# Patient Record
Sex: Female | Born: 1985 | Race: White | Hispanic: No | Marital: Married | Smoking: Never smoker
Health system: Southern US, Community
[De-identification: ages and names within clinical notes are randomized; demographics above are authoritative.]

## PROBLEM LIST (undated history)

## (undated) DIAGNOSIS — G43909 Migraine, unspecified, not intractable, without status migrainosus: Secondary | ICD-10-CM

---

## 2005-09-09 ENCOUNTER — Inpatient Hospital Stay (HOSPITAL_COMMUNITY): Admission: AD | Admit: 2005-09-09 | Discharge: 2005-09-09 | Payer: Self-pay | Admitting: Gynecology

## 2005-09-11 ENCOUNTER — Inpatient Hospital Stay (HOSPITAL_COMMUNITY): Admission: AD | Admit: 2005-09-11 | Discharge: 2005-09-11 | Payer: Self-pay | Admitting: Obstetrics & Gynecology

## 2009-02-07 ENCOUNTER — Emergency Department (HOSPITAL_BASED_OUTPATIENT_CLINIC_OR_DEPARTMENT_OTHER): Admission: EM | Admit: 2009-02-07 | Discharge: 2009-02-07 | Payer: Self-pay | Admitting: Emergency Medicine

## 2009-02-07 ENCOUNTER — Ambulatory Visit: Payer: Self-pay | Admitting: Diagnostic Radiology

## 2009-04-07 ENCOUNTER — Emergency Department (HOSPITAL_BASED_OUTPATIENT_CLINIC_OR_DEPARTMENT_OTHER): Admission: EM | Admit: 2009-04-07 | Discharge: 2009-04-07 | Payer: Self-pay | Admitting: Emergency Medicine

## 2012-04-20 ENCOUNTER — Encounter (HOSPITAL_BASED_OUTPATIENT_CLINIC_OR_DEPARTMENT_OTHER): Payer: Self-pay | Admitting: Emergency Medicine

## 2012-04-20 ENCOUNTER — Emergency Department (HOSPITAL_BASED_OUTPATIENT_CLINIC_OR_DEPARTMENT_OTHER)
Admission: EM | Admit: 2012-04-20 | Discharge: 2012-04-20 | Disposition: A | Discharge status: Home / Self Care | Payer: Self-pay | Attending: Emergency Medicine | Admitting: Emergency Medicine

## 2012-04-20 DIAGNOSIS — N760 Acute vaginitis: Secondary | ICD-10-CM | POA: Insufficient documentation

## 2012-04-20 DIAGNOSIS — Z3202 Encounter for pregnancy test, result negative: Secondary | ICD-10-CM | POA: Insufficient documentation

## 2012-04-20 DIAGNOSIS — R109 Unspecified abdominal pain: Secondary | ICD-10-CM | POA: Insufficient documentation

## 2012-04-20 LAB — URINALYSIS, ROUTINE W REFLEX MICROSCOPIC
Glucose, UA: NEGATIVE mg/dL
Ketones, ur: NEGATIVE mg/dL
Nitrite: NEGATIVE
Protein, ur: NEGATIVE mg/dL
Specific Gravity, Urine: 1.019 (ref 1.005–1.030)

## 2012-04-20 LAB — WET PREP, GENITAL
Trich, Wet Prep: NONE SEEN
Yeast Wet Prep HPF POC: NONE SEEN

## 2012-04-20 LAB — URINE MICROSCOPIC-ADD ON

## 2012-04-20 LAB — PREGNANCY, URINE: Preg Test, Ur: NEGATIVE

## 2012-04-20 MED ORDER — HYDROCODONE-ACETAMINOPHEN 5-325 MG PO TABS: 2.0000 | ORAL_TABLET | Freq: Once | ORAL | Status: DC

## 2012-04-20 NOTE — ED Notes (Signed)
Brown vaginal discharge with abd pain and "sharp vaginal pains" since last Thursday.  LNMP over a year ago.  She states she has always had irregular periods.  2 home pregnancy tests were negative.

## 2012-04-20 NOTE — ED Provider Notes (Signed)
History     CSN: [id number]  Arrival date & time 04/20/12  1320   First MD Initiated Contact with Patient 04/20/12 1336      Chief Complaint  Patient presents with  . Vaginal Discharge  . Abdominal Pain    (Consider location/radiation/quality/duration/timing/severity/associated sxs/prior treatment) Patient is a 27 y.o. female presenting with vaginal discharge and abdominal pain. The history is provided by the patient.  Vaginal Discharge This is a new problem. Episode onset: 8 days. The problem occurs constantly. Associated symptoms include abdominal pain. Associated symptoms comments: Vaginal discharge. Nothing aggravates the symptoms. She has tried nothing for the symptoms.  Abdominal Pain Associated symptoms: vaginal discharge     History reviewed. No pertinent past medical history.  History reviewed. No pertinent past surgical history.  No family history on file.  History  Substance Use Topics  . Smoking status: Never Smoker   . Smokeless tobacco: Not on file  . Alcohol Use: No    OB History   Grav Para Term Preterm Abortions TAB SAB Ect Mult Living                  Review of Systems  Gastrointestinal: Positive for abdominal pain.  Genitourinary: Positive for vaginal discharge.  All other systems reviewed and are negative.    Allergies  Review of patient's allergies indicates no known allergies.  Home Medications  No current outpatient prescriptions on file.  BP 160/106  Pulse 94  Temp(Src) 99 F (37.2 C) (Oral)  Ht 5\' 2"  (1.575 m)  Wt 215 lb (97.523 kg)  BMI 39.31 kg/m2  SpO2 100%  LMP 04/21/2011  Physical Exam  Nursing note and vitals reviewed. Constitutional: She appears well-developed and well-nourished.  HENT:  Head: Normocephalic.  Eyes: Conjunctivae are normal. Pupils are equal, round, and reactive to light.  Neck: Normal range of motion. Neck supple.  Cardiovascular: Normal rate.   Pulmonary/Chest: Effort normal and breath sounds  normal.  Abdominal: Soft. Bowel sounds are normal.  Genitourinary: Vaginal discharge found.  Musculoskeletal: Normal range of motion.  Neurological: She is alert.  Skin: Skin is warm.    ED Course  Procedures (including critical care time)  Labs Reviewed  WET PREP, GENITAL - Abnormal; Notable for the following:    WBC, Wet Prep HPF POC RARE (*)    All other components within normal limits  URINALYSIS, ROUTINE W REFLEX MICROSCOPIC - Abnormal; Notable for the following:    Hgb urine dipstick SMALL (*)    Leukocytes, UA TRACE (*)    All other components within normal limits  URINE MICROSCOPIC-ADD ON - Abnormal; Notable for the following:    Bacteria, UA FEW (*)    All other components within normal limits  GC/CHLAMYDIA PROBE AMP  PREGNANCY, URINE   No results found.   No diagnosis found.    MDM  Wet prep normal,  ua few bacteria,     Pt advised to follow up with her MD for recheck.   I will treat with diflucan.        Lonia Skinner Kaaawa, PA-C 04/20/12 1546

## 2012-04-21 NOTE — ED Provider Notes (Signed)
Medical screening examination/treatment/procedure(s) were performed by non-physician practitioner and as supervising physician I was immediately available for consultation/collaboration.  Doug Sou, MD 04/21/12 [phone]

## 2012-07-31 ENCOUNTER — Emergency Department (HOSPITAL_BASED_OUTPATIENT_CLINIC_OR_DEPARTMENT_OTHER)
Admission: EM | Admit: 2012-07-31 | Discharge: 2012-07-31 | Disposition: A | Discharge status: Home / Self Care | Payer: No Typology Code available for payment source | Attending: Emergency Medicine | Admitting: Emergency Medicine

## 2012-07-31 DIAGNOSIS — B86 Scabies: Secondary | ICD-10-CM

## 2012-07-31 DIAGNOSIS — J029 Acute pharyngitis, unspecified: Secondary | ICD-10-CM | POA: Insufficient documentation

## 2012-07-31 DIAGNOSIS — L299 Pruritus, unspecified: Secondary | ICD-10-CM | POA: Insufficient documentation

## 2012-07-31 DIAGNOSIS — J3489 Other specified disorders of nose and nasal sinuses: Secondary | ICD-10-CM | POA: Insufficient documentation

## 2012-07-31 DIAGNOSIS — J329 Chronic sinusitis, unspecified: Secondary | ICD-10-CM

## 2012-07-31 DIAGNOSIS — R21 Rash and other nonspecific skin eruption: Secondary | ICD-10-CM | POA: Insufficient documentation

## 2012-07-31 MED ORDER — ANTIPYRINE-BENZOCAINE 5.4-1.4 % OT SOLN
3.0000 [drp] | Freq: Once | OTIC | Status: AC
  Administered 2012-07-31: 4 [drp] via OTIC
  Filled 2012-07-31: qty 10

## 2012-07-31 MED ORDER — PSEUDOEPHEDRINE HCL 60 MG PO TABS: 60.0000 mg | ORAL_TABLET | ORAL | Status: DC | PRN

## 2012-07-31 MED ORDER — FLUTICASONE PROPIONATE 50 MCG/ACT NA SUSP: 2.0000 | Freq: Every day | NASAL | Status: DC

## 2012-07-31 MED ORDER — PERMETHRIN 5 % EX CREA: TOPICAL_CREAM | CUTANEOUS | Status: DC

## 2012-07-31 NOTE — ED Provider Notes (Signed)
   History    CSN: [id number] Arrival date & time 07/31/12  [place]on Gully  First MD Initiated Contact with Patient 07/31/12 2131     Chief Complaint  Patient presents with  . Otalgia   (Consider location/radiation/quality/duration/timing/severity/associated sxs/prior Treatment) HPI Lauren Monroe is a 27 y.o. female who presents to ED with complaint of a left ear ache. State started with sore throat yesterday, today left maxillary sinus pain and left ear pain. Denies nasal congestion. States tried tylenol and over the counter ear drops with no relief. Denies fever, chills, cough, malaise. States also has rash to the hands and feet. States her cousin had scabies several weeks ago. Rash started a week ago. States scratching it with a soft bristle brush. States itching worse at night. No treatments tried.    No past medical history on file. No past surgical history on file. No family history on file. History  Substance Use Topics  . Smoking status: Never Smoker   . Smokeless tobacco: Not on file  . Alcohol Use: No   OB History   Grav Para Term Preterm Abortions TAB SAB Ect Mult Living                 Review of Systems  Constitutional: Negative for fever and chills.  HENT: Positive for ear pain, sore throat and sinus pressure. Negative for hearing loss, congestion, neck pain, neck stiffness and ear discharge.   Eyes: Negative for pain and redness.  Respiratory: Negative.   Cardiovascular: Negative.   Gastrointestinal: Negative.   Musculoskeletal: Negative for myalgias and arthralgias.  Skin: Positive for rash.  Neurological: Negative for weakness, numbness and headaches.    Allergies  Review of patient's allergies indicates no known allergies.  Home Medications  No current outpatient prescriptions on file. BP 124/89  Pulse 105  Temp(Src) 99.1 F (37.3 C) (Oral)  Resp 20  Ht 5\' 2"  (1.575 m)  Wt 222 lb (100.699 kg)  BMI 40.59 kg/m2  SpO2 98% Physical Exam  Nursing note  and vitals reviewed. Constitutional: She appears well-developed and well-nourished. No distress.  HENT:  Head: Normocephalic and atraumatic.  Right Ear: Tympanic membrane, external ear and ear canal normal.  Left Ear: External ear and ear canal normal.  Nose: Left sinus exhibits maxillary sinus tenderness.  Mouth/Throat: Uvula is midline, oropharynx is clear and moist and mucous membranes are normal.  Left ear drug bulging, no erythema  Neck: Neck supple.  Cardiovascular: Normal rate, regular rhythm and normal heart sounds.   Pulmonary/Chest: Effort normal and breath sounds normal. No respiratory distress. She has no wheezes. She has no rales.  Skin: Skin is warm and dry.  Erythematous papular rash to the bilateral wrists, feet, ankles, toes    ED Course  Procedures (including critical care time) Labs Reviewed - No data to display No results found.  1. Sinusitis   2. Scabies     MDM  Pt with left sinus pain and pressure and left ear pain. Suspect sinusitis. Will start on flonase, sudafed, auralgan for earache. Rash consistent with scabies given appearance and recent contact. Will prescribe permethrin. Follow up with pcp.   Filed Vitals:   07/31/12 1924  BP: 124/89  Pulse: 105  Temp: 99.1 F (37.3 C)  TempSrc: Oral  Resp: 20  Height: 5\' 2"  (1.575 m)  Weight: 222 lb (100.699 kg)  SpO2: 98%     Savion Washam A Judd Mccubbin, PA-C 07/31/12 2348

## 2012-07-31 NOTE — ED Notes (Signed)
Left ear pain and sore throat x 1 day

## 2012-07-31 NOTE — ED Provider Notes (Signed)
Medical screening examination/treatment/procedure(s) were performed by non-physician practitioner and as supervising physician I was immediately available for consultation/collaboration.   Rolan Bucco, MD 07/31/12 [phone]

## 2013-04-12 ENCOUNTER — Emergency Department (HOSPITAL_BASED_OUTPATIENT_CLINIC_OR_DEPARTMENT_OTHER): Payer: No Typology Code available for payment source

## 2013-04-12 ENCOUNTER — Emergency Department (HOSPITAL_BASED_OUTPATIENT_CLINIC_OR_DEPARTMENT_OTHER)
Admission: EM | Admit: 2013-04-12 | Discharge: 2013-04-12 | Disposition: A | Discharge status: Home / Self Care | Payer: No Typology Code available for payment source | Attending: Emergency Medicine | Admitting: Emergency Medicine

## 2013-04-12 ENCOUNTER — Encounter (HOSPITAL_BASED_OUTPATIENT_CLINIC_OR_DEPARTMENT_OTHER): Payer: Self-pay | Admitting: Emergency Medicine

## 2013-04-12 DIAGNOSIS — W1789XA Other fall from one level to another, initial encounter: Secondary | ICD-10-CM | POA: Insufficient documentation

## 2013-04-12 DIAGNOSIS — S139XXA Sprain of joints and ligaments of unspecified parts of neck, initial encounter: Secondary | ICD-10-CM | POA: Insufficient documentation

## 2013-04-12 DIAGNOSIS — IMO0002 Reserved for concepts with insufficient information to code with codable children: Secondary | ICD-10-CM | POA: Insufficient documentation

## 2013-04-12 DIAGNOSIS — Y92009 Unspecified place in unspecified non-institutional (private) residence as the place of occurrence of the external cause: Secondary | ICD-10-CM | POA: Insufficient documentation

## 2013-04-12 DIAGNOSIS — S5000XA Contusion of unspecified elbow, initial encounter: Secondary | ICD-10-CM | POA: Insufficient documentation

## 2013-04-12 DIAGNOSIS — Y939 Activity, unspecified: Secondary | ICD-10-CM | POA: Insufficient documentation

## 2013-04-12 DIAGNOSIS — S161XXA Strain of muscle, fascia and tendon at neck level, initial encounter: Secondary | ICD-10-CM

## 2013-04-12 DIAGNOSIS — Z3202 Encounter for pregnancy test, result negative: Secondary | ICD-10-CM | POA: Insufficient documentation

## 2013-04-12 LAB — PREGNANCY, URINE: PREG TEST UR: NEGATIVE

## 2013-04-12 MED ORDER — CYCLOBENZAPRINE HCL 5 MG PO TABS: 5.0000 mg | ORAL_TABLET | Freq: Two times a day (BID) | ORAL | Status: DC | PRN

## 2013-04-12 NOTE — ED Provider Notes (Signed)
CSN: [id number]     Arrival date & time 04/12/13  1630 History   First MD Initiated Contact with Patient 04/12/13 1649     Chief Complaint  Patient presents with  . Fall     (Consider location/radiation/quality/duration/timing/severity/associated sxs/prior Treatment) HPI Comments: Pt state that she fell thru her porch yesterday. The porch is about 2 feet off the ground. No loc with fall. Pt states that her neck started to hurt immediately and it hasn't stopped. Denies numbness or weakness. Pt states that she also has a bruise to the left elbow  The history is provided by the patient. No language interpreter was used.    History reviewed. No pertinent past medical history. History reviewed. No pertinent past surgical history. No family history on file. History  Substance Use Topics  . Smoking status: Never Smoker   . Smokeless tobacco: Never Used  . Alcohol Use: No   OB History   Grav Para Term Preterm Abortions TAB SAB Ect Mult Living                 Review of Systems  Constitutional: Negative.   Respiratory: Negative.   Cardiovascular: Negative.       Allergies  Review of patient's allergies indicates no known allergies.  Home Medications   Current Outpatient Rx  Name  Route  Sig  Dispense  Refill  . fluticasone (FLONASE) 50 MCG/ACT nasal spray   Nasal   Place 2 sprays into the nose daily.   16 g   2   . permethrin (ELIMITE) 5 % cream      Apply neck down. Wash off after 8 hrs. Repeat in 1 wk   60 g   0   . pseudoephedrine (SUDAFED) 60 MG tablet   Oral   Take 1 tablet (60 mg total) by mouth every 4 (four) hours as needed for congestion.   30 tablet   0    BP 120/87  Pulse 99  Temp(Src) 98.1 F (36.7 C) (Oral)  Resp 20  Ht 5\' 3"  (1.6 m)  Wt 200 lb (90.719 kg)  BMI 35.44 kg/m2  SpO2 97%  LMP 02/01/2013 Physical Exam  Nursing note and vitals reviewed. Constitutional: She is oriented to person, place, and time. She appears well-developed and  well-nourished.  HENT:  Head: Normocephalic and atraumatic.  Cardiovascular: Normal rate and regular rhythm.   Pulmonary/Chest: Effort normal and breath sounds normal.  Abdominal: Soft. There is no tenderness.  Musculoskeletal:       Cervical back: She exhibits tenderness and bony tenderness. She exhibits normal range of motion.       Thoracic back: Normal.       Lumbar back: Normal.  Neurological: She is alert and oriented to person, place, and time. She exhibits normal muscle tone. Coordination normal.  Skin:  Bruise noted to the left elbow. Full rom. No gross deformity of swelling noted  Psychiatric: She has a normal mood and affect.    ED Course  Procedures (including critical care time) Labs Review Labs Reviewed  PREGNANCY, URINE   Imaging Review Dg Cervical Spine Complete  04/12/2013   CLINICAL DATA Right-sided neck pain  EXAM CERVICAL SPINE  4+ VIEWS  COMPARISON None.  FINDINGS The cervical spine is visualized to the level of C7.  The vertebral body heights are maintained. The alignment is normal. There is loss of the normal cervical lordosis with mild reversal. The prevertebral soft tissues are normal. There is no acute fracture or  static listhesis. Bilateral neural foramina are patent. The disc spaces are maintained.  IMPRESSION Negative cervical spine radiographs.  SIGNATURE  Electronically Signed   By: Elige Ko   On: 04/12/2013 17:42     EKG Interpretation None      MDM   Final diagnoses:  Cervical strain    Pt is neurologically intact. No acute injury noted. Will give flexeril and follow up with Dr. Pearletha Forge as needed    Teressa Lower, NP 04/12/13 1757

## 2013-04-12 NOTE — ED Provider Notes (Signed)
Medical screening examination/treatment/procedure(s) were performed by non-physician practitioner and as supervising physician I was immediately available for consultation/collaboration.   EKG Interpretation None        Khole Branch, MD 04/12/13 1847 

## 2013-04-12 NOTE — ED Notes (Signed)
C/o neck pain and left elbow pain after falling on porch yesterday

## 2013-04-12 NOTE — Discharge Instructions (Signed)
Cervical Strain and Sprain (Whiplash)  with Rehab  Cervical strain and sprains are injuries that commonly occur with "whiplash" injuries. Whiplash occurs when the neck is forcefully whipped backward or forward, such as during a motor vehicle accident. The muscles, ligaments, tendons, discs and nerves of the neck are susceptible to injury when this occurs.  SYMPTOMS   · Pain or stiffness in the front and/or back of neck  · Symptoms may present immediately or up to 24 hours after injury.  · Dizziness, headache, nausea and vomiting.  · Muscle spasm with soreness and stiffness in the neck.  · Tenderness and swelling at the injury site.  CAUSES   Whiplash injuries often occur during contact sports or motor vehicle accidents.   RISK INCREASES WITH:  · Osteoarthritis of the spine.  · Situations that make head or neck accidents or trauma more likely.  · High-risk sports (football, rugby, wrestling, hockey, auto racing, gymnastics, diving, contact karate or boxing).  · Poor strength and flexibility of the neck.  · Previous neck injury.  · Poor tackling technique.  · Improperly fitted or padded equipment.  PREVENTION  · Learn and use proper technique (avoid tackling with the head, spearing and head-butting; use proper falling techniques to avoid landing on the head).  · Warm up and stretch properly before activity.  · Maintain physical fitness:  · Strength, flexibility and endurance.  · Cardiovascular fitness.  · Wear properly fitted and padded protective equipment, such as padded soft collars, for participation in contact sports.  PROGNOSIS   Recovery for cervical strain and sprain injuries is dependent on the extent of the injury. These injuries are usually curable in 1 week to 3 months with appropriate treatment.   RELATED COMPLICATIONS   · Temporary numbness and weakness may occur if the nerve roots are damaged, and this may persist until the nerve has completely healed.  · Chronic pain due to frequent recurrence of  symptoms.  · Prolonged healing, especially if activity is resumed too soon (before complete recovery).  TREATMENT   Treatment initially involves the use of ice and medication to help reduce pain and inflammation. It is also important to perform strengthening and stretching exercises and modify activities that worsen symptoms so the injury does not get worse. These exercises may be performed at home or with a therapist. For patients who experience severe symptoms, a soft padded collar may be recommended to be worn around the neck.   Improving your posture may help reduce symptoms. Posture improvement includes pulling your chin and abdomen in while sitting or standing. If you are sitting, sit in a firm chair with your buttocks against the back of the chair. While sleeping, try replacing your pillow with a small towel rolled to 2 inches in diameter, or use a cervical pillow or soft cervical collar. Poor sleeping positions delay healing.   For patients with nerve root damage, which causes numbness or weakness, the use of a cervical traction apparatus may be recommended. Surgery is rarely necessary for these injuries. However, cervical strain and sprains that are present at birth (congenital) may require surgery.  MEDICATION   · If pain medication is necessary, nonsteroidal anti-inflammatory medications, such as aspirin and ibuprofen, or other minor pain relievers, such as acetaminophen, are often recommended.  · Do not take pain medication for 7 days before surgery.  · Prescription pain relievers may be given if deemed necessary by your caregiver. Use only as directed and only as much as you   need.  HEAT AND COLD:   · Cold treatment (icing) relieves pain and reduces inflammation. Cold treatment should be applied for 10 to 15 minutes every 2 to 3 hours for inflammation and pain and immediately after any activity that aggravates your symptoms. Use ice packs or an ice massage.  · Heat treatment may be used prior to  performing the stretching and strengthening activities prescribed by your caregiver, physical therapist, or athletic trainer. Use a heat pack or a warm soak.  SEEK MEDICAL CARE IF:   · Symptoms get worse or do not improve in 2 weeks despite treatment.  · New, unexplained symptoms develop (drugs used in treatment may produce side effects).  EXERCISES  RANGE OF MOTION (ROM) AND STRETCHING EXERCISES - Cervical Strain and Sprain  These exercises may help you when beginning to rehabilitate your injury. In order to successfully resolve your symptoms, you must improve your posture. These exercises are designed to help reduce the forward-head and rounded-shoulder posture which contributes to this condition. Your symptoms may resolve with or without further involvement from your physician, physical therapist or athletic trainer. While completing these exercises, remember:   · Restoring tissue flexibility helps normal motion to return to the joints. This allows healthier, less painful movement and activity.  · An effective stretch should be held for at least 20 seconds, although you may need to begin with shorter hold times for comfort.  · A stretch should never be painful. You should only feel a gentle lengthening or release in the stretched tissue.  STRETCH- Axial Extensors  · Lie on your back on the floor. You may bend your knees for comfort. Place a rolled up hand towel or dish towel, about 2 inches in diameter, under the part of your head that makes contact with the floor.  · Gently tuck your chin, as if trying to make a "double chin," until you feel a gentle stretch at the base of your head.  · Hold __________ seconds.  Repeat __________ times. Complete this exercise __________ times per day.   STRETECH - Axial Extension   · Stand or sit on a firm surface. Assume a good posture: chest up, shoulders drawn back, abdominal muscles slightly tense, knees unlocked (if standing) and feet hip width apart.  · Slowly retract your  chin so your head slides back and your chin slightly lowers.Continue to look straight ahead.  · You should feel a gentle stretch in the back of your head. Be certain not to feel an aggressive stretch since this can cause headaches later.  · Hold for __________ seconds.  Repeat __________ times. Complete this exercise __________ times per day.  STRETCH  Cervical Side Bend   · Stand or sit on a firm surface. Assume a good posture: chest up, shoulders drawn back, abdominal muscles slightly tense, knees unlocked (if standing) and feet hip width apart.  · Without letting your nose or shoulders move, slowly tip your right / left ear to your shoulder until your feel a gentle stretch in the muscles on the opposite side of your neck.  · Hold __________ seconds.  Repeat __________ times. Complete this exercise __________ times per day.  STRETCH  Cervical Rotators   · Stand or sit on a firm surface. Assume a good posture: chest up, shoulders drawn back, abdominal muscles slightly tense, knees unlocked (if standing) and feet hip width apart.  · Keeping your eyes level with the ground, slowly turn your head until you feel a gentle   stretch along the back and opposite side of your neck.  · Hold __________ seconds.  Repeat __________ times. Complete this exercise __________ times per day.  RANGE OF MOTION - Neck Circles   · Stand or sit on a firm surface. Assume a good posture: chest up, shoulders drawn back, abdominal muscles slightly tense, knees unlocked (if standing) and feet hip width apart.  · Gently roll your head down and around from the back of one shoulder to the back of the other. The motion should never be forced or painful.  · Repeat the motion 10-20 times, or until you feel the neck muscles relax and loosen.  Repeat __________ times. Complete the exercise __________ times per day.  STRENGTHENING EXERCISES - Cervical Strain and Sprain  These exercises may help you when beginning to rehabilitate your injury. They may  resolve your symptoms with or without further involvement from your physician, physical therapist or athletic trainer. While completing these exercises, remember:   · Muscles can gain both the endurance and the strength needed for everyday activities through controlled exercises.  · Complete these exercises as instructed by your physician, physical therapist or athletic trainer. Progress the resistance and repetitions only as guided.  · You may experience muscle soreness or fatigue, but the pain or discomfort you are trying to eliminate should never worsen during these exercises. If this pain does worsen, stop and make certain you are following the directions exactly. If the pain is still present after adjustments, discontinue the exercise until you can discuss the trouble with your clinician.  STRENGTH Cervical Flexors, Isometric  · Face a wall, standing about 6 inches away. Place a small pillow, a ball about 6-8 inches in diameter, or a folded towel between your forehead and the wall.  · Slightly tuck your chin and gently push your forehead into the soft object. Push only with mild to moderate intensity, building up tension gradually. Keep your jaw and forehead relaxed.  · Hold 10 to 20 seconds. Keep your breathing relaxed.  · Release the tension slowly. Relax your neck muscles completely before you start the next repetition.  Repeat __________ times. Complete this exercise __________ times per day.  STRENGTH- Cervical Lateral Flexors, Isometric   · Stand about 6 inches away from a wall. Place a small pillow, a ball about 6-8 inches in diameter, or a folded towel between the side of your head and the wall.  · Slightly tuck your chin and gently tilt your head into the soft object. Push only with mild to moderate intensity, building up tension gradually. Keep your jaw and forehead relaxed.  · Hold 10 to 20 seconds. Keep your breathing relaxed.  · Release the tension slowly. Relax your neck muscles completely before  you start the next repetition.  Repeat __________ times. Complete this exercise __________ times per day.  STRENGTH  Cervical Extensors, Isometric   · Stand about 6 inches away from a wall. Place a small pillow, a ball about 6-8 inches in diameter, or a folded towel between the back of your head and the wall.  · Slightly tuck your chin and gently tilt your head back into the soft object. Push only with mild to moderate intensity, building up tension gradually. Keep your jaw and forehead relaxed.  · Hold 10 to 20 seconds. Keep your breathing relaxed.  · Release the tension slowly. Relax your neck muscles completely before you start the next repetition.  Repeat __________ times. Complete this exercise __________ times per   day.  POSTURE AND BODY MECHANICS CONSIDERATIONS - Cervical Strain and Sprain  Keeping correct posture when sitting, standing or completing your activities will reduce the stress put on different body tissues, allowing injured tissues a chance to heal and limiting painful experiences. The following are general guidelines for improved posture. Your physician or physical therapist will provide you with any instructions specific to your needs. While reading these guidelines, remember:  · The exercises prescribed by your provider will help you have the flexibility and strength to maintain correct postures.  · The correct posture provides the optimal environment for your joints to work. All of your joints have less wear and tear when properly supported by a spine with good posture. This means you will experience a healthier, less painful body.  · Correct posture must be practiced with all of your activities, especially prolonged sitting and standing. Correct posture is as important when doing repetitive low-stress activities (typing) as it is when doing a single heavy-load activity (lifting).  PROLONGED STANDING WHILE SLIGHTLY LEANING FORWARD  When completing a task that requires you to lean forward while  standing in one place for a long time, place either foot up on a stationary 2-4 inch high object to help maintain the best posture. When both feet are on the ground, the low back tends to lose its slight inward curve. If this curve flattens (or becomes too large), then the back and your other joints will experience too much stress, fatigue more quickly and can cause pain.   RESTING POSITIONS  Consider which positions are most painful for you when choosing a resting position. If you have pain with flexion-based activities (sitting, bending, stooping, squatting), choose a position that allows you to rest in a less flexed posture. You would want to avoid curling into a fetal position on your side. If your pain worsens with extension-based activities (prolonged standing, working overhead), avoid resting in an extended position such as sleeping on your stomach. Most people will find more comfort when they rest with their spine in a more neutral position, neither too rounded nor too arched. Lying on a non-sagging bed on your side with a pillow between your knees, or on your back with a pillow under your knees will often provide some relief. Keep in mind, being in any one position for a prolonged period of time, no matter how correct your posture, can still lead to stiffness.  WALKING  Walk with an upright posture. Your ears, shoulders and hips should all line-up.  OFFICE WORK  When working at a desk, create an environment that supports good, upright posture. Without extra support, muscles fatigue and lead to excessive strain on joints and other tissues.  CHAIR:  · A chair should be able to slide under your desk when your back makes contact with the back of the chair. This allows you to work closely.  · The chair's height should allow your eyes to be level with the upper part of your monitor and your hands to be slightly lower than your elbows.  · Body position:  · Your feet should make contact with the floor. If this is  not possible, use a foot rest.  · Keep your ears over your shoulders. This will reduce stress on your neck and low back.  Document Released: 01/19/2005 Document Revised: 05/16/2012 Document Reviewed: 05/03/2008  [employer]® Patient Information ©2014 [employer].

## 2013-05-22 ENCOUNTER — Encounter (HOSPITAL_BASED_OUTPATIENT_CLINIC_OR_DEPARTMENT_OTHER): Payer: Self-pay | Admitting: Emergency Medicine

## 2013-05-22 ENCOUNTER — Emergency Department (HOSPITAL_BASED_OUTPATIENT_CLINIC_OR_DEPARTMENT_OTHER)
Admission: EM | Admit: 2013-05-22 | Discharge: 2013-05-22 | Disposition: A | Discharge status: Home / Self Care | Payer: No Typology Code available for payment source | Attending: Emergency Medicine | Admitting: Emergency Medicine

## 2013-05-22 DIAGNOSIS — Z79899 Other long term (current) drug therapy: Secondary | ICD-10-CM | POA: Insufficient documentation

## 2013-05-22 DIAGNOSIS — J020 Streptococcal pharyngitis: Secondary | ICD-10-CM | POA: Insufficient documentation

## 2013-05-22 DIAGNOSIS — IMO0002 Reserved for concepts with insufficient information to code with codable children: Secondary | ICD-10-CM | POA: Insufficient documentation

## 2013-05-22 LAB — RAPID STREP SCREEN (MED CTR MEBANE ONLY): Streptococcus, Group A Screen (Direct): POSITIVE — AB

## 2013-05-22 MED ORDER — LIDOCAINE VISCOUS 2 % MT SOLN
15.0000 mL | Freq: Once | OROMUCOSAL | Status: AC
  Administered 2013-05-22: 15 mL via OROMUCOSAL
  Filled 2013-05-22: qty 15

## 2013-05-22 MED ORDER — HYDROCODONE-HOMATROPINE 5-1.5 MG/5ML PO SYRP: 5.0000 mL | ORAL_SOLUTION | Freq: Four times a day (QID) | ORAL | Status: DC | PRN

## 2013-05-22 MED ORDER — PENICILLIN G BENZATHINE 1200000 UNIT/2ML IM SUSP
1.2000 10*6.[IU] | Freq: Once | INTRAMUSCULAR | Status: AC
  Administered 2013-05-22: 1.2 10*6.[IU] via INTRAMUSCULAR
  Filled 2013-05-22: qty 2

## 2013-05-22 NOTE — ED Notes (Signed)
Sore throat and congestion since sat. Had low grade fever last night.

## 2013-05-22 NOTE — Discharge Instructions (Signed)
Please follow up with your primary care physician in 1-2 days. If you do not have one please call the [facility] number listed above. Please use Hycodan as prescribed for throat pain, please do not drive on this medication as it contains a narcotic. Please read all discharge instructions and return precautions.    Pharyngitis Pharyngitis is redness, pain, and swelling (inflammation) of your pharynx.  CAUSES  Pharyngitis is usually caused by infection. Most of the time, these infections are from viruses (viral) and are part of a cold. However, sometimes pharyngitis is caused by bacteria (bacterial). Pharyngitis can also be caused by allergies. Viral pharyngitis may be spread from person to person by coughing, sneezing, and personal items or utensils (cups, forks, spoons, toothbrushes). Bacterial pharyngitis may be spread from person to person by more intimate contact, such as kissing.  SIGNS AND SYMPTOMS  Symptoms of pharyngitis include:   Sore throat.   Tiredness (fatigue).   Low-grade fever.   Headache.  Joint pain and muscle aches.  Skin rashes.  Swollen lymph nodes.  Plaque-like film on throat or tonsils (often seen with bacterial pharyngitis). DIAGNOSIS  Your health care provider will ask you questions about your illness and your symptoms. Your medical history, along with a physical exam, is often all that is needed to diagnose pharyngitis. Sometimes, a rapid strep test is done. Other lab tests may also be done, depending on the suspected cause.  TREATMENT  Viral pharyngitis will usually get better in 3 4 days without the use of medicine. Bacterial pharyngitis is treated with medicines that kill germs (antibiotics).  HOME CARE INSTRUCTIONS   Drink enough water and fluids to keep your urine clear or pale yellow.   Only take over-the-counter or prescription medicines as directed by your health care provider:   If you are prescribed antibiotics, make  sure you finish them even if you start to feel better.   Do not take aspirin.   Get lots of rest.   Gargle with 8 oz of salt water ( tsp of salt per 1 qt of water) as often as every 1 2 hours to soothe your throat.   Throat lozenges (if you are not at risk for choking) or sprays may be used to soothe your throat. SEEK MEDICAL CARE IF:   You have large, tender lumps in your neck.  You have a rash.  You cough up green, yellow-brown, or bloody spit. SEEK IMMEDIATE MEDICAL CARE IF:   Your neck becomes stiff.  You drool or are unable to swallow liquids.  You vomit or are unable to keep medicines or liquids down.  You have severe pain that does not go away with the use of recommended medicines.  You have trouble breathing (not caused by a stuffy nose). MAKE SURE YOU:   Understand these instructions.  Will watch your condition.  Will get help right away if you are not doing well or get worse. Document Released: 01/19/2005 Document Revised: 11/09/2012 Document Reviewed: 09/26/2012 [facility] Patient Information 2014 [place], [place].

## 2013-05-22 NOTE — ED Provider Notes (Signed)
CSN: [id number]     Arrival date & time 05/22/13  1147 History   First MD Initiated Contact with Patient 05/22/13 1248     Chief Complaint  Patient presents with  . Sore Throat     (Consider location/radiation/quality/duration/timing/severity/associated sxs/prior Treatment) HPI Comments: Patient is a 28 year old female presented to emergency department for 3 days of sore throat, nasal congestion, rhinorrhea and fever (TMAX 100.69F last evening). Patient states she has been taking Tylenol for her symptoms with improvement of fever. She denies any alleviating factors for her throat. She states eating and drinking aggravate her pain. She states she has been in the hospital recently visiting an ill relative but otherwise denies any known sick contacts. Denies any voice changes, difficulty swallowing, cough.   History reviewed. No pertinent past medical history. History reviewed. No pertinent past surgical history. No family history on file. History  Substance Use Topics  . Smoking status: Never Smoker   . Smokeless tobacco: Never Used  . Alcohol Use: No   OB History   Grav Para Term Preterm Abortions TAB SAB Ect Mult Living                 Review of Systems  Constitutional: Positive for fever.  HENT: Positive for congestion, rhinorrhea and sore throat. Negative for trouble swallowing and voice change.   All other systems reviewed and are negative.     Allergies  Review of patient's allergies indicates no known allergies.  Home Medications   Prior to Admission medications   Medication Sig Start Date End Date Taking? Authorizing Provider  acetaminophen (TYLENOL) 500 MG tablet Take 500 mg by mouth every 6 (six) hours as needed.   Yes Historical Provider, MD  guaiFENesin (MUCINEX) 600 MG 12 hr tablet Take 600 mg by mouth 2 (two) times daily.   Yes Historical Provider, MD  cyclobenzaprine (FLEXERIL) 5 MG tablet Take 1 tablet (5 mg total) by mouth 2 (two) times daily as needed for  muscle spasms. 04/12/13   Teressa LowerVrinda Pickering, NP  fluticasone (FLONASE) 50 MCG/ACT nasal spray Place 2 sprays into the nose daily. 07/31/12   Tatyana A Kirichenko, PA-C  permethrin (ELIMITE) 5 % cream Apply neck down. Wash off after 8 hrs. Repeat in 1 wk 07/31/12   Tatyana A Kirichenko, PA-C  pseudoephedrine (SUDAFED) 60 MG tablet Take 1 tablet (60 mg total) by mouth every 4 (four) hours as needed for congestion. 07/31/12   Tatyana A Kirichenko, PA-C   BP 125/73  Pulse 99  Temp(Src) 99.5 F (37.5 C) (Oral)  Resp 20  SpO2 99%  LMP 02/01/2013 Physical Exam  Nursing note and vitals reviewed. Constitutional: She is oriented to person, place, and time. She appears well-developed and well-nourished. No distress.  HENT:  Head: Normocephalic and atraumatic.  Right Ear: External ear normal.  Left Ear: External ear normal.  Nose: Nose normal.  Mouth/Throat: Uvula is midline and mucous membranes are normal. No trismus in the jaw. No dental abscesses or uvula swelling. Posterior oropharyngeal erythema present. No oropharyngeal exudate or tonsillar abscesses.  Eyes: Conjunctivae are normal.  Neck: Normal range of motion. Neck supple.  Cardiovascular: Normal rate, regular rhythm and normal heart sounds.   Pulmonary/Chest: Effort normal and breath sounds normal. No respiratory distress.  Abdominal: Soft. There is no tenderness.  Musculoskeletal: Normal range of motion.  Neurological: She is alert and oriented to person, place, and time.  Skin: Skin is warm and dry. She is not diaphoretic.  Psychiatric: She has a  normal mood and affect.    ED Course  Procedures (including critical care time) Medications  penicillin g benzathine (BICILLIN LA) 1200000 UNIT/2ML injection 1.2 Million Units (1.2 Million Units Intramuscular Given 05/22/13 1310)  lidocaine (XYLOCAINE) 2 % viscous mouth solution 15 mL (15 mLs Mouth/Throat Given 05/22/13 1310)    Labs Review Labs Reviewed  RAPID STREP SCREEN - Abnormal;  Notable for the following:    Streptococcus, Group A Screen (Direct) POSITIVE (*)    All other components within normal limits    Imaging Review No results found.   EKG Interpretation None      MDM   Final diagnoses:  Strep pharyngitis    Filed Vitals:   05/22/13 1247  BP: 125/73  Pulse: 99  Temp: 99.5 F (37.5 C)  Resp: 20   Afebrile, NAD, non-toxic appearing, AAOx4.  Pt afebrile with tonsillar erythema, cervical lymphadenopathy, & dysphagia; diagnosis of strep. Treated in the Ed with xylocaine and PCN IM.  Pt appears mildly dehydrated, discussed importance of water rehydration. Presentation non concerning for PTA or infxn spread to soft tissue. No trismus or uvula deviation. Specific return precautions discussed. Pt able to drink water in ED without difficulty with intact air way. Recommended PCP follow up. Patient is agreeable to plan. Patient is stable at time of discharge       Jeannetta EllisJennifer L Chantry Headen, PA-C 05/22/13 1425

## 2013-05-24 NOTE — ED Provider Notes (Signed)
Medical screening examination/treatment/procedure(s) were performed by non-physician practitioner and as supervising physician I was immediately available for consultation/collaboration.   EKG Interpretation None        Jamas Jaquay B. Estle Huguley, MD 05/24/13 1353 

## 2014-05-27 ENCOUNTER — Emergency Department (HOSPITAL_BASED_OUTPATIENT_CLINIC_OR_DEPARTMENT_OTHER)
Admission: EM | Admit: 2014-05-27 | Discharge: 2014-05-27 | Disposition: A | Discharge status: Home / Self Care | Payer: No Typology Code available for payment source | Attending: Emergency Medicine | Admitting: Emergency Medicine

## 2014-05-27 ENCOUNTER — Encounter (HOSPITAL_BASED_OUTPATIENT_CLINIC_OR_DEPARTMENT_OTHER): Payer: Self-pay | Admitting: *Deleted

## 2014-05-27 ENCOUNTER — Emergency Department (HOSPITAL_BASED_OUTPATIENT_CLINIC_OR_DEPARTMENT_OTHER): Payer: No Typology Code available for payment source

## 2014-05-27 DIAGNOSIS — Z7951 Long term (current) use of inhaled steroids: Secondary | ICD-10-CM | POA: Insufficient documentation

## 2014-05-27 DIAGNOSIS — Z3202 Encounter for pregnancy test, result negative: Secondary | ICD-10-CM | POA: Insufficient documentation

## 2014-05-27 DIAGNOSIS — Z79899 Other long term (current) drug therapy: Secondary | ICD-10-CM | POA: Insufficient documentation

## 2014-05-27 DIAGNOSIS — K5900 Constipation, unspecified: Secondary | ICD-10-CM | POA: Insufficient documentation

## 2014-05-27 LAB — URINE MICROSCOPIC-ADD ON

## 2014-05-27 LAB — PREGNANCY, URINE: PREG TEST UR: NEGATIVE

## 2014-05-27 LAB — URINALYSIS, ROUTINE W REFLEX MICROSCOPIC
BILIRUBIN URINE: NEGATIVE
Glucose, UA: NEGATIVE mg/dL
Ketones, ur: NEGATIVE mg/dL
LEUKOCYTES UA: NEGATIVE
NITRITE: NEGATIVE
Protein, ur: NEGATIVE mg/dL
SPECIFIC GRAVITY, URINE: 1.021 (ref 1.005–1.030)
Urobilinogen, UA: 1 mg/dL (ref 0.0–1.0)
pH: 6 (ref 5.0–8.0)

## 2014-05-27 MED ORDER — DOCUSATE SODIUM 100 MG PO CAPS: 100.0000 mg | ORAL_CAPSULE | Freq: Two times a day (BID) | ORAL | Status: DC

## 2014-05-27 MED ORDER — POLYETHYLENE GLYCOL 3350 17 G PO PACK: 17.0000 g | PACK | Freq: Every day | ORAL | Status: DC

## 2014-05-27 MED ORDER — DICYCLOMINE HCL 20 MG PO TABS: 20.0000 mg | ORAL_TABLET | Freq: Two times a day (BID) | ORAL | Status: DC | PRN

## 2014-05-27 NOTE — ED Provider Notes (Signed)
CSN: [id number]     Arrival date & time 05/27/14  0051 History   First MD Initiated Contact with Patient 05/27/14 0300     Chief Complaint  Patient presents with  . Constipation     (Consider location/radiation/quality/duration/timing/severity/associated sxs/prior Treatment) HPI Patient presents with constipation for the last 2 weeks. Is associated with mild abdominal cramping. No nausea or vomiting. No fever or chills. Patient states he has tried several enemas without relief. Denies any recent dietary changes but has started taking Effexor over the last month. History reviewed. No pertinent past medical history. History reviewed. No pertinent past surgical history. No family history on file. History  Substance Use Topics  . Smoking status: Never Smoker   . Smokeless tobacco: Never Used  . Alcohol Use: No   OB History    No data available     Review of Systems  Constitutional: Negative for fever and chills.  Respiratory: Negative for shortness of breath.   Cardiovascular: Negative for chest pain.  Gastrointestinal: Positive for abdominal pain and constipation. Negative for nausea, vomiting, diarrhea and blood in stool.  Genitourinary: Negative for dysuria and flank pain.  Musculoskeletal: Negative for back pain, neck pain and neck stiffness.  Skin: Negative for rash and wound.  Neurological: Negative for dizziness, weakness, light-headedness, numbness and headaches.  All other systems reviewed and are negative.     Allergies  Zoloft  Home Medications   Prior to Admission medications   Medication Sig Start Date End Date Taking? Authorizing Provider  venlafaxine (EFFEXOR) 75 MG tablet Take 75 mg by mouth 2 (two) times daily.   Yes Historical Provider, MD  acetaminophen (TYLENOL) 500 MG tablet Take 500 mg by mouth every 6 (six) hours as needed.    Historical Provider, MD  cyclobenzaprine (FLEXERIL) 5 MG tablet Take 1 tablet (5 mg total) by mouth 2 (two) times daily as  needed for muscle spasms. 04/12/13   Teressa Lower, NP  dicyclomine (BENTYL) 20 MG tablet Take 1 tablet (20 mg total) by mouth 2 (two) times daily as needed for spasms. 05/27/14   Loren Racer, MD  docusate sodium (COLACE) 100 MG capsule Take 1 capsule (100 mg total) by mouth every 12 (twelve) hours. 05/27/14   Loren Racer, MD  fluticasone (FLONASE) 50 MCG/ACT nasal spray Place 2 sprays into the nose daily. 07/31/12   Tatyana Kirichenko, PA-C  guaiFENesin (MUCINEX) 600 MG 12 hr tablet Take 600 mg by mouth 2 (two) times daily.    Historical Provider, MD  HYDROcodone-homatropine (HYCODAN) 5-1.5 MG/5ML syrup Take 5 mLs by mouth every 6 (six) hours as needed for cough (sore throat). 05/22/13   Jennifer Piepenbrink, PA-C  permethrin (ELIMITE) 5 % cream Apply neck down. Wash off after 8 hrs. Repeat in 1 wk 07/31/12   Tatyana Kirichenko, PA-C  polyethylene glycol (MIRALAX / GLYCOLAX) packet Take 17 g by mouth daily. 05/27/14   Loren Racer, MD  pseudoephedrine (SUDAFED) 60 MG tablet Take 1 tablet (60 mg total) by mouth every 4 (four) hours as needed for congestion. 07/31/12   Tatyana Kirichenko, PA-C   BP 114/70 mmHg  Pulse 87  Temp(Src) 98.7 F (37.1 C) (Oral)  Resp 22  Wt 221 lb 12.8 oz (100.608 kg)  SpO2 97%  LMP 04/08/2014 Physical Exam  Constitutional: She is oriented to person, place, and time. She appears well-developed and well-nourished. No distress.  HENT:  Head: Normocephalic and atraumatic.  Mouth/Throat: Oropharynx is clear and moist.  Eyes: EOM are normal. Pupils are  equal, round, and reactive to light.  Neck: Normal range of motion. Neck supple.  Cardiovascular: Normal rate and regular rhythm.   Pulmonary/Chest: Effort normal and breath sounds normal. No respiratory distress. She has no wheezes. She has no rales.  Abdominal: Soft. Bowel sounds are normal. She exhibits no distension and no mass. There is no tenderness. There is no rebound and no guarding.  Musculoskeletal:  Normal range of motion. She exhibits no edema or tenderness.  No CVA tenderness.  Neurological: She is alert and oriented to person, place, and time.  Skin: Skin is warm and dry. No rash noted. No erythema.  Psychiatric: She has a normal mood and affect. Her behavior is normal.  Nursing note and vitals reviewed.   ED Course  Procedures (including critical care time) Labs Review Labs Reviewed  URINALYSIS, ROUTINE W REFLEX MICROSCOPIC - Abnormal; Notable for the following:    Hgb urine dipstick TRACE (*)    All other components within normal limits  PREGNANCY, URINE  URINE MICROSCOPIC-ADD ON    Imaging Review Dg Abd 1 View  05/27/2014   CLINICAL DATA:  Constipation for 2 weeks with abdominal distention.  EXAM: ABDOMEN - 1 VIEW  COMPARISON:  None.  FINDINGS: The bowel gas pattern is normal. No radio-opaque calculi or other significant radiographic abnormality are seen. Moderate stool burden most notable in the RIGHT colon.  IMPRESSION: Negative.   Electronically Signed   By: Davonna BellingJohn  Curnes M.D.   On: 05/27/2014 02:44     EKG Interpretation None      MDM   Final diagnoses:  Constipation, unspecified constipation type    Moderate constipation especially in the ascending colon. We'll start on bowel regimen. Abdominal exam is benign. Return precautions given.    Loren Raceravid Ebrahim Deremer, MD 05/27/14 [phone]

## 2014-05-27 NOTE — ED Notes (Signed)
Pt states that she has not had a good BM in 2 weeks. Pt has given herself an enema Wednesday pm and again Friday pm with only result being return of brown liquid.  Pt states that she has abdominal distention.  Abdomen is soft.  Pt reports mid abdominal pressure.  Pt denies any impaction.

## 2014-05-27 NOTE — Discharge Instructions (Signed)

## 2015-04-03 ENCOUNTER — Ambulatory Visit (INDEPENDENT_AMBULATORY_CARE_PROVIDER_SITE_OTHER): Payer: Self-pay | Admitting: Family Medicine

## 2015-04-03 ENCOUNTER — Encounter: Payer: Self-pay | Admitting: Family Medicine

## 2015-04-03 VITALS — BP 115/74 | HR 109 | Temp 98.9°F | Ht 62.0 in | Wt 242.9 lb

## 2015-04-03 DIAGNOSIS — Z3202 Encounter for pregnancy test, result negative: Secondary | ICD-10-CM

## 2015-04-03 DIAGNOSIS — Z124 Encounter for screening for malignant neoplasm of cervix: Secondary | ICD-10-CM

## 2015-04-03 DIAGNOSIS — N926 Irregular menstruation, unspecified: Secondary | ICD-10-CM

## 2015-04-03 DIAGNOSIS — E8881 Metabolic syndrome: Secondary | ICD-10-CM | POA: Insufficient documentation

## 2015-04-03 DIAGNOSIS — L680 Hirsutism: Secondary | ICD-10-CM

## 2015-04-03 DIAGNOSIS — N970 Female infertility associated with anovulation: Secondary | ICD-10-CM | POA: Insufficient documentation

## 2015-04-03 LAB — POCT PREGNANCY, URINE: Preg Test, Ur: NEGATIVE

## 2015-04-03 MED ORDER — METFORMIN HCL 500 MG PO TABS: ORAL_TABLET | ORAL | Status: DC

## 2015-04-03 NOTE — Patient Instructions (Addendum)
Polycystic Ovarian Syndrome Polycystic ovarian syndrome (PCOS) is a common hormonal disorder among women of reproductive age. Most women with PCOS grow many small cysts on their ovaries. PCOS can cause problems with your periods and make it difficult to get pregnant. It can also cause an increased risk of miscarriage with pregnancy. If left untreated, PCOS can lead to serious health problems, such as diabetes and heart disease. CAUSES The cause of PCOS is not fully understood, but genetics may be a factor. SIGNS AND SYMPTOMS   Infrequent or no menstrual periods.   Inability to get pregnant (infertility) because of not ovulating.   Increased growth of hair on the face, chest, stomach, back, thumbs, thighs, or toes.   Acne, oily skin, or dandruff.   Pelvic pain.   Weight gain or obesity, usually carrying extra weight around the waist.   Type 2 diabetes.   High cholesterol.   High blood pressure.   Female-pattern baldness or thinning hair.   Patches of thickened and dark brown or black skin on the neck, arms, breasts, or thighs.   Tiny excess flaps of skin (skin tags) in the armpits or neck area.   Excessive snoring and having breathing stop at times while asleep (sleep apnea).   Deepening of the voice.   Gestational diabetes when pregnant.  DIAGNOSIS  There is no single test to diagnose PCOS.   Your health care provider will:   Take a medical history.   Perform a pelvic exam.   Have ultrasonography done.   Check your female and female hormone levels.   Measure glucose or sugar levels in the blood.   Do other blood tests.   If you are producing too many female hormones, your health care provider will make sure it is from PCOS. At the physical exam, your health care provider will want to evaluate the areas of increased hair growth. Try to allow natural hair growth for a few days before the visit.   During a pelvic exam, the ovaries may be enlarged  or swollen because of the increased number of small cysts. This can be seen more easily by using vaginal ultrasonography or screening to examine the ovaries and lining of the uterus (endometrium) for cysts. The uterine lining may become thicker if you have not been having a regular period.  TREATMENT  Because there is no cure for PCOS, it needs to be managed to prevent problems. Treatments are based on your symptoms. Treatment is also based on whether you want to have a baby or whether you need contraception.  Treatment may include:   Progesterone hormone to start a menstrual period.   Birth control pills to make you have regular menstrual periods.   Medicines to make you ovulate, if you want to get pregnant.   Medicines to control your insulin.   Medicine to control your blood pressure.   Medicine and diet to control your high cholesterol and triglycerides in your blood.  Medicine to reduce excessive hair growth.  Surgery, making small holes in the ovary, to decrease the amount of female hormone production. This is done through a long, lighted tube (laparoscope) placed into the pelvis through a tiny incision in the lower abdomen.  HOME CARE INSTRUCTIONS  Only take over-the-counter or prescription medicine as directed by your health care provider.  Pay attention to the foods you eat and your activity levels. This can help reduce the effects of PCOS.  Keep your weight under control.  Eat foods that are   low in carbohydrate and high in fiber.  Exercise regularly. SEEK MEDICAL CARE IF:  Your symptoms do not get better with medicine.  You have new symptoms.   This information is not intended to replace advice given to you by your health care provider. Make sure you discuss any questions you have with your health care provider.   Document Released: 05/15/2004 Document Revised: 11/09/2012 Document Reviewed: 07/07/2012 Elsevier Interactive Patient Education 2016 [employer]  Diet for Metabolic Syndrome Metabolic syndrome is a disorder that includes at least three of these conditions:  Abdominal obesity.  Too much sugar in your blood.  High blood pressure.  Higher than normal amount of fat (lipids) in your blood.  Lower than normal level of "good" cholesterol (HDL). Following a healthy diet can help to keep metabolic syndrome under control. It can also help to prevent the development of conditions that are associated with metabolic syndrome, such as diabetes, heart disease, and stroke. Along with exercise, a healthy diet:  Helps to improve the way that the body uses insulin.  Promotes weight loss. A common goal for people with this condition is to lose at least 7 to 10 percent of their starting weight. WHAT DO I NEED TO KNOW ABOUT THIS DIET?  Use the glycemic index (GI) to plan your meals. The index tells you how quickly a food will raise your blood sugar. Choose foods that have low GI values. These foods take a longer time to raise blood sugar.  Keep track of how many calories you take in. Eating the right amount of calories will help your achieve a healthy weight.  You may want to follow a Mediterranean diet. This diet includes lots of vegetables, lean meats or fish, whole grains, fruits, and healthy oils and fats. WHAT FOODS CAN I EAT? Grains Stone-ground whole wheat. Pumpernickel bread. Whole-grain bread, crackers, tortillas, cereal, and pasta. Unsweetened oatmeal.Bulgur.Barley.Quinoa.Brown rice or wild rice. Vegetables Lettuce. Spinach. Peas. Beets. Cauliflower. Cabbage. Broccoli. Carrots. Tomatoes. Squash. Eggplant. Herbs. Peppers. Onions. Cucumbers. Brussels sprouts. Sweet potatoes. Yams. Beans. Lentils. Fruits Berries. Apples. Oranges. Grapes. Mango. Pomegranate. Kiwi. Cherries. Meats and Other Protein Sources Seafood and shellfish. Lean meats.Poultry. Tofu. Dairy Low-fat or fat-free dairy products, such as milk, yogurt, and  cheese. Beverages Water. Low-fat milk. Milk alternatives, like soy milk or almond milk. Real fruit juice. Condiments Low-sugar or sugar-free ketchup, barbecue sauce, and mayonnaise. Mustard. Relish. Fats and Oils Avocado. Canola or olive oil. Nuts and nut butters.Seeds. The items listed above may not be a complete list of recommended foods or beverages. Contact your dietitian for more options.  WHAT FOODS ARE NOT RECOMMENDED? Red meat. Palm oil and coconut oil. Processed foods. Fried foods. Alcohol. Sweetened drinks, such as iced tea and soda. Sweets. Salty foods. The items listed above may not be a complete list of foods and beverages to avoid. Contact your dietitian for more information.   This information is not intended to replace advice given to you by your health care provider. Make sure you discuss any questions you have with your health care provider.   Document Released: 06/05/2014 Document Reviewed: 06/05/2014 Elsevier Interactive Patient Education [employer]

## 2015-04-03 NOTE — Progress Notes (Signed)
Patient ID: Lauren Monroe, female   DOB: [birth year], 30 y.o.   MRN: [id number]   CLINIC ENCOUNTER NOTE  History:  30 y.o. G0P0000 here today for abnormal bleeding pattern and concern for PCOS.  Reports irregular periods. She has been spotting everyday for 1 year. She reports she has gone >1 year without a period and then had a 3 month period. She reports noticing hair growth on her chin. She was seen by her PCP 1 year ago and reports normal thyroid at that time but "prediabetes."  She has been having unprotected sex for 7 years and not conceived. She does not desire a pregnancy at this time and has 3 step children.   She denies any abnormal vaginal discharge, pelvic pain or other concerns.   No past medical history on file.  No past surgical history on file.  The following portions of the patient's history were reviewed and updated as appropriate: allergies, current medications, past family history, past medical history, past social history, past surgical history and problem list.   Health Maintenance:  No GYN visit/pap in "years".    Review of Systems:  Pertinent items noted in HPI and remainder of comprehensive ROS otherwise negative.  Objective:  Physical Exam BP 115/74 mmHg  Pulse 109  Temp(Src) 98.9 F (37.2 C) (Oral)  Ht  (1.575 m)  Wt 242 lb 14.4 oz (110.179 kg)  BMI 44.42 kg/m2  LMP 02/02/2015 (Approximate) CONSTITUTIONAL: Well-developed, well-nourished female in no acute distress. Obese female HENT:  Normocephalic, atraumatic. External right and left ear normal. Oropharynx is clear and moist EYES: Conjunctivae and EOM are normal. Pupils are equal, round, and reactive to light. No scleral icterus.  NECK: Normal range of motion, supple, no masses. + hirsutism on lower chin/neck. SKIN: Skin is warm and dry. No rash noted. Not diaphoretic. No erythema. No pallor. NEUROLGIC: Alert and oriented to person, place, and time. Normal reflexes, muscle tone coordination. No  cranial nerve deficit noted. PSYCHIATRIC: Normal mood and affect. Normal behavior. Normal judgment and thought content. CARDIOVASCULAR: Normal heart rate noted RESPIRATORY: Effort and breath sounds normal, no problems with respiration noted ABDOMEN: Soft, no distention noted.   PELVIC: Normal appearing external genitalia; normal appearing vaginal mucosa and cervix.  No abnormal discharge noted.  Normal uterine size, no other palpable masses, no uterine or adnexal tenderness. pap collected MUSCULOSKELETAL: Normal range of motion. No edema noted.  Labs and Imaging No results found.  Assessment & Plan:  1. Anovulatory (dysfunctional uterine) bleeding- likely PCOS given PE findings and bleeding pattern.  - TSH - Hemoglobin A1c - US Transvaginal Non-OB; Future - metFORMIN (GLUCOPHAGE) 500 MG tablet; Take one tablet by mouth daily for one week. Then increase to one tablet twice a day for one week.  Then two tablets twice a day.  Dispense: 60 tablet; Refill: 5 - PAP smear collected today -discussed endometrial bx and liletta placement -recommend starting spironolactone at next visit to help with hair growth -discussed the importance of weight loss   Routine preventative health maintenance measures emphasized. Please refer to After Visit Summary for other counseling recommendations.   Return in about 4 weeks (around 05/01/2015) for follow up US results/PCOS/possible endometrial bx.  Total face-to-face time with patient: 30 minutes. Over 50% of encounter was spent on counseling and coordination of care.

## 2015-04-04 LAB — HEMOGLOBIN A1C

## 2015-04-04 LAB — TSH: TSH: 3.65 mIU/L

## 2015-04-05 LAB — CYTOLOGY - PAP

## 2015-04-15 ENCOUNTER — Ambulatory Visit (HOSPITAL_COMMUNITY)
Admission: RE | Admit: 2015-04-15 | Discharge: 2015-04-15 | Disposition: A | Discharge status: Home / Self Care | Payer: Self-pay | Source: Ambulatory Visit | Attending: Family Medicine | Admitting: Family Medicine

## 2015-04-15 DIAGNOSIS — N854 Malposition of uterus: Secondary | ICD-10-CM | POA: Insufficient documentation

## 2015-04-15 DIAGNOSIS — N970 Female infertility associated with anovulation: Secondary | ICD-10-CM

## 2015-04-15 DIAGNOSIS — N926 Irregular menstruation, unspecified: Secondary | ICD-10-CM | POA: Insufficient documentation

## 2015-04-19 ENCOUNTER — Telehealth: Payer: Self-pay | Admitting: General Practice

## 2015-04-19 NOTE — Telephone Encounter (Signed)
Patient missed call from clinics, returned call requesting call back with results, patient said it is okay to leave results on voicemail.

## 2015-04-19 NOTE — Telephone Encounter (Signed)
Per Dr Alvester MorinNewton, patient's ultrasound does not show multiple cysts however this does not mean she does not have PCOS. We can discuss this further at her next visit. I would recommend a sample of the lining of her uterus to help us determine if she has any complications for her irregular bleeding pattern. Called patient, no answer- left message stating we are trying to reach you with non urgent results, please call us back at the clinics

## 2015-04-22 NOTE — Telephone Encounter (Signed)
Patient has been given her results of lab results and will follow up at the next appt.

## 2015-05-01 ENCOUNTER — Ambulatory Visit (INDEPENDENT_AMBULATORY_CARE_PROVIDER_SITE_OTHER): Payer: Self-pay | Admitting: Obstetrics & Gynecology

## 2015-05-01 ENCOUNTER — Encounter: Payer: Self-pay | Admitting: Obstetrics & Gynecology

## 2015-05-01 VITALS — BP 131/86 | HR 96 | Ht 63.0 in | Wt 240.1 lb

## 2015-05-01 DIAGNOSIS — E282 Polycystic ovarian syndrome: Secondary | ICD-10-CM

## 2015-05-01 MED ORDER — NORGESTIMATE-ETH ESTRADIOL 0.25-35 MG-MCG PO TABS: 1.0000 | ORAL_TABLET | Freq: Every day | ORAL | Status: DC

## 2015-05-01 NOTE — Progress Notes (Signed)
CLINIC ENCOUNTER NOTE  History:  30 y.o. G0P0000 here today for follow up after recent ultrasound, and starting Metformin for PCOS treatment.  Reports some nausea and vomiting with the Metformin, also attributed to possible GERD. She denies any abnormal vaginal discharge, bleeding, pelvic pain or other concerns.   History reviewed. No pertinent past medical history.  History reviewed. No pertinent past surgical history.  The following portions of the patient's history were reviewed and updated as appropriate: allergies, current medications, past family history, past medical history, past social history, past surgical history and problem list.   Health Maintenance:  Normal pap on 04/03/15.  Review of Systems:  Pertinent items noted in HPI and remainder of comprehensive ROS otherwise negative.  Objective:  Physical Exam BP 131/86 mmHg  Pulse 96  Ht 5\' 3"  (1.6 m)  Wt 240 lb 2.2 oz (108.927 kg)  BMI 42.55 kg/m2  LMP 02/02/2015 (Approximate) CONSTITUTIONAL: Well-developed, well-nourished female in no acute distress.  HENT:  Normocephalic, atraumatic. External right and left ear normal. Oropharynx is clear and moist EYES: Conjunctivae and EOM are normal. Pupils are equal, round, and reactive to light. No scleral icterus.  NECK: Normal range of motion, supple, no masses SKIN: Skin is warm and dry. No rash noted. Not diaphoretic. No erythema. No pallor. NEUROLOGIC: Alert and oriented to person, place, and time. Normal reflexes, muscle tone coordination. No cranial nerve deficit noted. PSYCHIATRIC: Normal mood and affect. Normal behavior. Normal judgment and thought content. CARDIOVASCULAR: Normal heart rate noted RESPIRATORY: Effort and breath sounds normal, no problems with respiration noted ABDOMEN: Soft, no distention noted.   PELVIC: Deferred MUSCULOSKELETAL: Normal range of motion. No edema noted.  Labs and Imaging [place] Transvaginal Non-ob  04/15/2015  CLINICAL DATA:  30 year old  female with irregular menstrual cycles and clinical concern for polycystic ovarian syndrome. EXAM: TRANSABDOMINAL AND TRANSVAGINAL ULTRASOUND OF PELVIS TECHNIQUE: Both transabdominal and transvaginal ultrasound examinations of the pelvis were performed. Transabdominal technique was performed for global imaging of the pelvis including uterus, ovaries, adnexal regions, and pelvic cul-de-sac. It was necessary to proceed with endovaginal exam following the transabdominal exam to visualize the endometrium and ovaries. COMPARISON:  None FINDINGS: Uterus Measurements: 8.8 x 2.5 x 4.3 cm. The anteverted anteflexed uterus is normal in size and configuration. There are no uterine fibroids or other myometrial abnormalities. Endometrium Thickness: 7 mm. No endometrial cavity fluid or focal endometrial mass detected. Right ovary Measurements: 2.9 x 2.5 x 2.2 cm, for a right ovarian volume of 8.4 cc. There are fewer than 12 subcentimeter right ovarian follicles. No suspicious right ovarian or right adnexal masses. Left ovary The left ovary is visualized only on the transabdominal portion of the scan. Measurements: 1.9 x 3.0 x 3.2 cm, for a left ovarian volume of 9.8 cc. There are fewer than 12 subcentimeter left ovarian follicles. No suspicious left ovarian or left adnexal masses. Other findings No abnormal free fluid. IMPRESSION: 1. Nonenlarged ovaries do not meet sonographic criteria for polycystic ovarian syndrome. Recommend correlation with clinical history and serum hormone testing. 2. Normal anteverted uterus. No uterine fibroids. No endometrial abnormality. Electronically Signed   By: Delbert PhenixJason A Poff M.D.   On: 04/15/2015 10:35   [place] Pelvis Complete  04/15/2015  CLINICAL DATA:  30 year old female with irregular menstrual cycles and clinical concern for polycystic ovarian syndrome. EXAM: TRANSABDOMINAL AND TRANSVAGINAL ULTRASOUND OF PELVIS TECHNIQUE: Both transabdominal and transvaginal ultrasound examinations of the pelvis  were performed. Transabdominal technique was performed for global imaging of the pelvis  including uterus, ovaries, adnexal regions, and pelvic cul-de-sac. It was necessary to proceed with endovaginal exam following the transabdominal exam to visualize the endometrium and ovaries. COMPARISON:  None FINDINGS: Uterus Measurements: 8.8 x 2.5 x 4.3 cm. The anteverted anteflexed uterus is normal in size and configuration. There are no uterine fibroids or other myometrial abnormalities. Endometrium Thickness: 7 mm. No endometrial cavity fluid or focal endometrial mass detected. Right ovary Measurements: 2.9 x 2.5 x 2.2 cm, for a right ovarian volume of 8.4 cc. There are fewer than 12 subcentimeter right ovarian follicles. No suspicious right ovarian or right adnexal masses. Left ovary The left ovary is visualized only on the transabdominal portion of the scan. Measurements: 1.9 x 3.0 x 3.2 cm, for a left ovarian volume of 9.8 cc. There are fewer than 12 subcentimeter left ovarian follicles. No suspicious left ovarian or left adnexal masses. Other findings No abnormal free fluid. IMPRESSION: 1. Nonenlarged ovaries do not meet sonographic criteria for polycystic ovarian syndrome. Recommend correlation with clinical history and serum hormone testing. 2. Normal anteverted uterus. No uterine fibroids. No endometrial abnormality. Electronically Signed   By: Delbert Phenix M.D.   On: 04/15/2015 10:35    Assessment & Plan:  1. PCOS (polycystic ovarian syndrome) Patient does have PCOS (irregular menses, signs of hyperandrogenism) despite not having ultrasound criteria.  She was reassured that this was the correct diagnosis. - norgestimate-ethinyl estradiol (ORTHO-CYCLEN,SPRINTEC,PREVIFEM) 0.25-35 MG-MCG tablet; Take 1 tablet by mouth daily.  Dispense: 1 Package; Refill: 11 Counseled patient about management of PCOS, recommended weight loss which helps with restoring ovulatory cycles, decreases glucose intolerance with  improvement of metabolic risk, improves fertility/pregnancy rates and helps with overall health.  Even modest weight loss (5 to 10 percent reduction in body weight) in women with PCOS may result in these effects.  OCPs are also the mainstay of pharmacologic therapy for women with PCOS for managing hyperandrogenism and menstrual dysfunction and for providing contraception.    PCOS is also treated with Metformin given its association with glucose intolerance and insulin resistance.  Over 50% of PCOS patients on  of Metformin daily have been shown to ovulate successfully. Common side effects include GI intolerance, kidney and liver enzyme irregularities, lactic acidosis.    Given her nausea, she was told to try OTC GERD remedies, and maybe back down to 1500 mg of Metformin daily.  OCPs were also prescribed for patient.   Routine preventative health maintenance measures emphasized. Please refer to After Visit Summary for other counseling recommendations.   Return in about 2 months (around 07/01/2015) for OCP check and PCOS follow up.   Total face-to-face time with patient: 15 minutes. Over 50% of encounter was spent on counseling and coordination of care.   Jaynie Collins, MD, FACOG Attending Obstetrician & Gynecologist, [facility] and Center for [facility]

## 2015-05-01 NOTE — Patient Instructions (Signed)
Polycystic Ovarian Syndrome  Polycystic ovarian syndrome (PCOS) is a common hormonal disorder among women of reproductive age. Most women with PCOS grow many small cysts on their ovaries. PCOS can cause problems with your periods and make it difficult to get pregnant. It can also cause an increased risk of miscarriage with pregnancy. If left untreated, PCOS can lead to serious health problems, such as diabetes and heart disease.  CAUSES  The cause of PCOS is not fully understood, but genetics may be a factor.  SIGNS AND SYMPTOMS   · Infrequent or no menstrual periods.    · Inability to get pregnant (infertility) because of not ovulating.    · Increased growth of hair on the face, chest, stomach, back, thumbs, thighs, or toes.    · Acne, oily skin, or dandruff.    · Pelvic pain.    · Weight gain or obesity, usually carrying extra weight around the waist.    · Type 2 diabetes.     · High cholesterol.    · High blood pressure.    · Female-pattern baldness or thinning hair.    · Patches of thickened and dark brown or black skin on the neck, arms, breasts, or thighs.    · Tiny excess flaps of skin (skin tags) in the armpits or neck area.    · Excessive snoring and having breathing stop at times while asleep (sleep apnea).    · Deepening of the voice.    · Gestational diabetes when pregnant.    DIAGNOSIS   There is no single test to diagnose PCOS.   · Your health care provider will:      Take a medical history.      Perform a pelvic exam.      Have ultrasonography done.      Check your female and female hormone levels.      Measure glucose or sugar levels in the blood.      Do other blood tests.    · If you are producing too many female hormones, your health care provider will make sure it is from PCOS. At the physical exam, your health care provider will want to evaluate the areas of increased hair growth. Try to allow natural hair growth for a few days before the visit.    · During a pelvic exam, the ovaries may be enlarged  or swollen because of the increased number of small cysts. This can be seen more easily by using vaginal ultrasonography or screening to examine the ovaries and lining of the uterus (endometrium) for cysts. The uterine lining may become thicker if you have not been having a regular period.    TREATMENT   Because there is no cure for PCOS, it needs to be managed to prevent problems. Treatments are based on your symptoms. Treatment is also based on whether you want to have a baby or whether you need contraception.   Treatment may include:   · Progesterone hormone to start a menstrual period.    · Birth control pills to make you have regular menstrual periods.    · Medicines to make you ovulate, if you want to get pregnant.    · Medicines to control your insulin.    · Medicine to control your blood pressure.    · Medicine and diet to control your high cholesterol and triglycerides in your blood.  · Medicine to reduce excessive hair growth.   · Surgery, making small holes in the ovary, to decrease the amount of female hormone production. This is done through a long, lighted tube (laparoscope) placed into the pelvis through a tiny incision in the lower abdomen.      HOME CARE INSTRUCTIONS  · Only take over-the-counter or prescription medicine as directed by your health care provider.  · Pay attention to the foods you eat and your activity levels. This can help reduce the effects of PCOS.    Keep your weight under control.    Eat foods that are low in carbohydrate and high in fiber.    Exercise regularly.  SEEK MEDICAL CARE IF:  · Your symptoms do not get better with medicine.  · You have new symptoms.     This information is not intended to replace advice given to you by your health care provider. Make sure you discuss any questions you have with your health care provider.     Document Released: 05/15/2004 Document Revised: 11/09/2012 Document Reviewed: 07/07/2012  Elsevier Interactive Patient Education ©2016 [employer]

## 2015-05-21 ENCOUNTER — Telehealth: Payer: Self-pay | Admitting: Obstetrics & Gynecology

## 2015-05-21 NOTE — Telephone Encounter (Signed)
Patient called to ask questions about her meds that's causing bleeding.

## 2015-05-21 NOTE — Telephone Encounter (Signed)
Pt wanted to make sure it was normal to bleed heavy after stating birth control pills.

## 2015-06-10 ENCOUNTER — Ambulatory Visit: Payer: Self-pay | Admitting: Obstetrics & Gynecology

## 2015-07-11 ENCOUNTER — Encounter (HOSPITAL_BASED_OUTPATIENT_CLINIC_OR_DEPARTMENT_OTHER): Payer: Self-pay

## 2015-07-11 ENCOUNTER — Emergency Department (HOSPITAL_BASED_OUTPATIENT_CLINIC_OR_DEPARTMENT_OTHER)
Admission: EM | Admit: 2015-07-11 | Discharge: 2015-07-11 | Disposition: A | Discharge status: Home / Self Care | Payer: Self-pay | Attending: Emergency Medicine | Admitting: Emergency Medicine

## 2015-07-11 DIAGNOSIS — R51 Headache: Secondary | ICD-10-CM

## 2015-07-11 DIAGNOSIS — G43909 Migraine, unspecified, not intractable, without status migrainosus: Secondary | ICD-10-CM | POA: Insufficient documentation

## 2015-07-11 DIAGNOSIS — R519 Headache, unspecified: Secondary | ICD-10-CM

## 2015-07-11 HISTORY — DX: Migraine, unspecified, not intractable, without status migrainosus: G43.909

## 2015-07-11 LAB — PREGNANCY, URINE: PREG TEST UR: NEGATIVE

## 2015-07-11 MED ORDER — PROCHLORPERAZINE EDISYLATE 5 MG/ML IJ SOLN
5.0000 mg | Freq: Once | INTRAMUSCULAR | Status: DC
  Filled 2015-07-11: qty 2

## 2015-07-11 MED ORDER — DIPHENHYDRAMINE HCL 50 MG/ML IJ SOLN
25.0000 mg | Freq: Once | INTRAMUSCULAR | Status: AC
  Administered 2015-07-11: 25 mg via INTRAVENOUS
  Filled 2015-07-11: qty 1

## 2015-07-11 MED ORDER — KETOROLAC TROMETHAMINE 30 MG/ML IJ SOLN
30.0000 mg | Freq: Once | INTRAMUSCULAR | Status: AC
  Administered 2015-07-11: 30 mg via INTRAVENOUS
  Filled 2015-07-11: qty 1

## 2015-07-11 MED ORDER — PROCHLORPERAZINE EDISYLATE 5 MG/ML IJ SOLN
5.0000 mg | Freq: Once | INTRAMUSCULAR | Status: AC
  Administered 2015-07-11: 5 mg via INTRAVENOUS

## 2015-07-11 MED ORDER — SODIUM CHLORIDE 0.9 % IV BOLUS (SEPSIS)
1000.0000 mL | Freq: Once | INTRAVENOUS | Status: AC
  Administered 2015-07-11: 1000 mL via INTRAVENOUS

## 2015-07-11 NOTE — ED Provider Notes (Signed)
CSN: [id number]     Arrival date & time 07/11/15  1954 History   First MD Initiated Contact with Patient 07/11/15 2108     Chief Complaint  Patient presents with  . Migraine     (Consider location/radiation/quality/duration/timing/severity/associated sxs/prior Treatment) HPI   Lauren Monroe is a 30 y.o. female who complains of migraine headache for 3 day(s). She has a well established history of recurrent migraines.Description of pain: throbbing pain, global. Associated symptoms: light sensitivity, nausea, vomiting and blurry vision. Patient has already taken advil and excedrin for this headache without relief. Denies fevers, chills, myalgias, arthralgias. Denies DOE, SOB, chest tightness or pressure, radiation to left arm, jaw or back, or diaphoresis. Denies dysuria, flank pain, suprapubic pain, frequency, urgency, or hematuria. Denies headaches, light headedness, weakness, visual disturbances. Denies abdominal pain, nausea, vomiting, diarrhea or constipation.    No current facility-administered medications for this encounter.   Current Outpatient Prescriptions  Medication Sig Dispense Refill  . acetaminophen (TYLENOL) 500 MG tablet Take 500 mg by mouth every 6 (six) hours as needed. Reported on 04/03/2015    . aspirin-acetaminophen-caffeine (EXCEDRIN MIGRAINE) 250-250-65 MG tablet Take 1 tablet by mouth every 6 (six) hours as needed for headache.    . metFORMIN (GLUCOPHAGE) 500 MG tablet Take one tablet by mouth daily for one week. Then increase to one tablet twice a day for one week.  Then two tablets twice a day. 60 tablet 5           Past Medical History  Diagnosis Date  . Migraine    History reviewed. No pertinent past surgical history. No family history on file. Social History  Substance Use Topics  . Smoking status: Never Smoker   . Smokeless tobacco: Never Used  . Alcohol Use: No   OB History    Gravida Para Term Preterm AB TAB SAB Ectopic Multiple Living    0 0 0 0 0 0 0 0 0 0      Review of Systems  Ten systems reviewed and are negative for acute change, except as noted in the HPI.     Allergies  Zoloft  Home Medications   Prior to Admission medications   Medication Sig Start Date End Date Taking? Authorizing Provider  acetaminophen (TYLENOL) 500 MG tablet Take 500 mg by mouth every 6 (six) hours as needed. Reported on 04/03/2015    Historical Provider, MD  aspirin-acetaminophen-caffeine (EXCEDRIN MIGRAINE) 8175098634250-250-65 MG tablet Take 1 tablet by mouth every 6 (six) hours as needed for headache.    Historical Provider, MD  metFORMIN (GLUCOPHAGE) 500 MG tablet Take one tablet by mouth daily for one week. Then increase to one tablet twice a day for one week.  Then two tablets twice a day. 04/03/15   Federico FlakeKimberly Niles Newton, MD   BP 129/88 mmHg  Pulse 95  Temp(Src) 98.2 F (36.8 C) (Oral)  Resp 16  Ht 5\' 2"  (1.575 m)  Wt 104.327 kg  BMI 42.06 kg/m2  SpO2 97%  LMP 06/25/2015 Physical Exam  Constitutional: She is oriented to person, place, and time. She appears well-developed and well-nourished. No distress.  HENT:  Head: Normocephalic and atraumatic.  Mouth/Throat: Oropharynx is clear and moist.  Eyes: Conjunctivae and EOM are normal. Pupils are equal, round, and reactive to light. No scleral icterus.  No horizontal, vertical or rotational nystagmus  Neck: Normal range of motion. Neck supple.  Full active and passive ROM without pain No midline or paraspinal tenderness No nuchal rigidity or meningeal  signs  Cardiovascular: Normal rate, regular rhythm and intact distal pulses.   Pulmonary/Chest: Effort normal and breath sounds normal. No respiratory distress. She has no wheezes. She has no rales.  Abdominal: Soft. Bowel sounds are normal. There is no tenderness. There is no rebound and no guarding.  Musculoskeletal: Normal range of motion.  Lymphadenopathy:    She has no cervical adenopathy.  Neurological: She is alert and oriented  to person, place, and time. She has normal reflexes. No cranial nerve deficit. She exhibits normal muscle tone. Coordination normal.  Mental Status:  Alert, oriented, thought content appropriate. Speech fluent without evidence of aphasia. Able to follow 2 step commands without difficulty.  Cranial Nerves:  II:  Peripheral visual fields grossly normal, pupils equal, round, reactive to light III,IV, VI: ptosis not present, extra-ocular motions intact bilaterally  V,VII: smile symmetric, facial light touch sensation equal VIII: hearing grossly normal bilaterally  IX,X: midline uvula rise  XI: bilateral shoulder shrug equal and strong XII: midline tongue extension  Motor:  5/5 in upper and lower extremities bilaterally including strong and equal grip strength and dorsiflexion/plantar flexion Sensory: Pinprick and light touch normal in all extremities.  Deep Tendon Reflexes: 2+ and symmetric  Cerebellar: normal finger-to-nose with bilateral upper extremities Gait: normal gait and balance CV: distal pulses palpable throughout   Skin: Skin is warm and dry. No rash noted. She is not diaphoretic.  Psychiatric: She has a normal mood and affect. Her behavior is normal. Judgment and thought content normal.  Nursing note and vitals reviewed.   ED Course  Procedures (including critical care time) Labs Review Labs Reviewed  PREGNANCY, URINE    Imaging Review No results found. I have personally reviewed and evaluated these images and lab results as part of my medical decision-making.   EKG Interpretation None      MDM   Final diagnoses:  Bad headache    Pt HA treated and improved while in ED.  Presentation is like pts typical HA and non concerning for [facility], ICH, Meningitis, or temporal arteritis. Pt is afebrile with no focal neuro deficits, nuchal rigidity, or change in vision. Pt is to follow up with PCP to discuss prophylactic medication. Pt verbalizes understanding and is agreeable  with plan to dc.      Kanyla Omeara, PA-C 07/15/15 1610  Rolland Porter, MD 07/27/15 [phone]

## 2015-07-11 NOTE — ED Notes (Signed)
Migraine n/v x 3 days-NAD-steady gait

## 2015-07-11 NOTE — Discharge Instructions (Signed)

## 2015-08-14 ENCOUNTER — Emergency Department (HOSPITAL_BASED_OUTPATIENT_CLINIC_OR_DEPARTMENT_OTHER)
Admission: EM | Admit: 2015-08-14 | Discharge: 2015-08-14 | Disposition: A | Discharge status: Home / Self Care | Payer: No Typology Code available for payment source | Attending: Emergency Medicine | Admitting: Emergency Medicine

## 2015-08-14 ENCOUNTER — Emergency Department (HOSPITAL_BASED_OUTPATIENT_CLINIC_OR_DEPARTMENT_OTHER): Payer: No Typology Code available for payment source

## 2015-08-14 ENCOUNTER — Encounter (HOSPITAL_BASED_OUTPATIENT_CLINIC_OR_DEPARTMENT_OTHER): Payer: Self-pay | Admitting: *Deleted

## 2015-08-14 DIAGNOSIS — Y9389 Activity, other specified: Secondary | ICD-10-CM | POA: Diagnosis not present

## 2015-08-14 DIAGNOSIS — S42292A Other displaced fracture of upper end of left humerus, initial encounter for closed fracture: Secondary | ICD-10-CM | POA: Diagnosis not present

## 2015-08-14 DIAGNOSIS — S40812A Abrasion of left upper arm, initial encounter: Secondary | ICD-10-CM | POA: Diagnosis not present

## 2015-08-14 DIAGNOSIS — T07XXXA Unspecified multiple injuries, initial encounter: Secondary | ICD-10-CM

## 2015-08-14 DIAGNOSIS — Y999 Unspecified external cause status: Secondary | ICD-10-CM | POA: Diagnosis not present

## 2015-08-14 DIAGNOSIS — Y92410 Unspecified street and highway as the place of occurrence of the external cause: Secondary | ICD-10-CM | POA: Insufficient documentation

## 2015-08-14 DIAGNOSIS — S4992XA Unspecified injury of left shoulder and upper arm, initial encounter: Secondary | ICD-10-CM | POA: Diagnosis present

## 2015-08-14 MED ORDER — HYDROCODONE-ACETAMINOPHEN 5-325 MG PO TABS
2.0000 | ORAL_TABLET | Freq: Once | ORAL | Status: AC
  Administered 2015-08-14: 2 via ORAL
  Filled 2015-08-14: qty 2

## 2015-08-14 MED ORDER — IBUPROFEN 800 MG PO TABS
800.0000 mg | ORAL_TABLET | Freq: Once | ORAL | Status: AC
  Administered 2015-08-14: 800 mg via ORAL
  Filled 2015-08-14: qty 1

## 2015-08-14 MED ORDER — BACITRACIN ZINC 500 UNIT/GM EX OINT
TOPICAL_OINTMENT | Freq: Once | CUTANEOUS | Status: AC
  Administered 2015-08-14: 1 via TOPICAL

## 2015-08-14 MED ORDER — DOCUSATE SODIUM 100 MG PO CAPS: 100.0000 mg | ORAL_CAPSULE | Freq: Two times a day (BID) | ORAL | Status: AC

## 2015-08-14 MED ORDER — HYDROCODONE-ACETAMINOPHEN 5-325 MG PO TABS: 1.0000 | ORAL_TABLET | Freq: Four times a day (QID) | ORAL | Status: AC | PRN
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
## 2015-08-14 NOTE — Discharge Instructions (Signed)
Humerus Fracture Treated With Immobilization The humerus is the large bone in your upper arm. You have a broken (fractured) humerus. These fractures are easily diagnosed with X-rays. TREATMENT  Simple fractures which will heal without disability are treated with simple immobilization. Immobilization means you will wear a cast, splint, or sling. You have a fracture which will do well with immobilization. The fracture will heal well simply by being held in a good position until it is stable enough to begin range of motion exercises. Do not take part in activities which would further injure your arm.  HOME CARE INSTRUCTIONS   Put ice on the injured area.  Put ice in a plastic bag.  Place a towel between your skin and the bag.  Leave the ice on for 15-20 minutes, 03-04 times a day.  If you have a cast:  Do not scratch the skin under the cast using sharp or pointed objects.  Check the skin around the cast every day. You may put lotion on any red or sore areas.  Keep your cast dry and clean.  If you have a splint:  Wear the splint as directed.  Keep your splint dry and clean.  You may loosen the elastic around the splint if your fingers become numb, tingle, or turn cold or blue.  If you have a sling:  Wear the sling as directed.  Do not put pressure on any part of your cast or splint until it is fully hardened.  Your cast or splint can be protected during bathing with a plastic bag. Do not lower the cast or splint into water.  Only take over-the-counter or prescription medicines for pain, discomfort, or fever as directed by your caregiver.  Do range of motion exercises as instructed by your caregiver.  Follow up as directed by your caregiver. This is very important in order to avoid permanent injury or disability and chronic pain. SEEK IMMEDIATE MEDICAL CARE IF:   Your skin or nails in the injured arm turn blue or gray.  Your arm feels cold or numb.  You develop severe pain  in the injured arm.  You are having problems with the medicines you were given. MAKE SURE YOU:   Understand these instructions.  Will watch your condition.  Will get help right away if you are not doing well or get worse.   This information is not intended to replace advice given to you by your health care provider. Make sure you discuss any questions you have with your health care provider.   Document Released: 04/27/2000 Document Revised: 02/09/2014 Document Reviewed: 06/13/2014 Elsevier Interactive Patient Education 2016 Else[employer] Inc.   RICE for Routine Care of Injuries Theroutine careofmanyinjuriesincludes rest, ice, compression, and elevation (RICE therapy). RICE therapy is often recommended for injuries to soft tissues, such as a muscle strain, ligament injuries, bruises, and overuse injuries. It can also be used for some bony injuries. Using RICE therapy can help to relieve pain, lessen swelling, and enable your body to heal. Rest Rest is required to allow your body to heal. This usually involves reducing your normal activities and avoiding use of the injured part of your body. Generally, you can return to your normal activities when you are comfortable and have been given permission by your health care provider. Ice Icing your injury helps to keep the swelling down, and it lessens pain. Do not apply ice directly to your skin.  Put ice in a plastic bag.  Place a towel between your skin  and the bag.  Leave the ice on for 20 minutes, 2-3 times a day. Do this for as long as you are directed by your health care provider. Compression Compression means putting pressure on the injured area. Compression helps to keep swelling down, gives support, and helps with discomfort. Compression may be done with an elastic bandage. If an elastic bandage has been applied, follow these general tips:  Remove and reapply the bandage every 3-4 hours or as directed by your health care  provider.  Make sure the bandage is not wrapped too tightly, because this can cut off circulation. If part of your body beyond the bandage becomes blue, numb, cold, swollen, or more painful, your bandage is most likely too tight. If this occurs, remove your bandage and reapply it more loosely.  See your health care provider if the bandage seems to be making your problems worse rather than better. Elevation Elevation means keeping the injured area raised. This helps to lessen swelling and decrease pain. If possible, your injured area should be elevated at or above the level of your heart or the center of your chest. WHEN SHOULD I SEEK MEDICAL CARE? You should seek medical care if:  Your pain and swelling continue.  Your symptoms are getting worse rather than improving. These symptoms may indicate that further evaluation or further X-rays are needed. Sometimes, X-rays may not show a small broken bone (fracture) until a number of days later. Make a follow-up appointment with your health care provider. WHEN SHOULD I SEEK IMMEDIATE MEDICAL CARE? You should seek immediate medical care if:  You have sudden severe pain at or below the area of your injury.  You have redness or increased swelling around your injury.  You have tingling or numbness at or below the area of your injury that does not improve after you remove the elastic bandage.   This information is not intended to replace advice given to you by your health care provider. Make sure you discuss any questions you have with your health care provider.   Document Released: 05/03/2000 Document Revised: 10/10/2014 Document Reviewed: 12/27/2013 Elsevier Interactive Patient Education [employer]

## 2015-08-14 NOTE — ED Notes (Signed)
MD at bedside. 
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
## 2015-08-16 ENCOUNTER — Other Ambulatory Visit (HOSPITAL_COMMUNITY): Payer: Self-pay | Admitting: Orthopedic Surgery

## 2015-08-16 DIAGNOSIS — S42294A Other nondisplaced fracture of upper end of right humerus, initial encounter for closed fracture: Secondary | ICD-10-CM

## 2015-08-16 DIAGNOSIS — M25512 Pain in left shoulder: Secondary | ICD-10-CM

## 2015-08-20 ENCOUNTER — Ambulatory Visit (HOSPITAL_COMMUNITY)
Admission: RE | Admit: 2015-08-20 | Discharge: 2015-08-20 | Disposition: A | Discharge status: Home / Self Care | Payer: Self-pay | Source: Ambulatory Visit | Attending: Orthopedic Surgery | Admitting: Orthopedic Surgery

## 2015-08-20 DIAGNOSIS — S42294A Other nondisplaced fracture of upper end of right humerus, initial encounter for closed fracture: Secondary | ICD-10-CM

## 2015-08-20 DIAGNOSIS — M25512 Pain in left shoulder: Secondary | ICD-10-CM

## 2015-08-20 DIAGNOSIS — S4292XA Fracture of left shoulder girdle, part unspecified, initial encounter for closed fracture: Secondary | ICD-10-CM | POA: Insufficient documentation

## 2015-08-20 DIAGNOSIS — X58XXXA Exposure to other specified factors, initial encounter: Secondary | ICD-10-CM | POA: Insufficient documentation

## 2016-12-19 IMAGING — US US PELVIS COMPLETE
1 series · 15 of 25 positions shown · non-contrast
Comparison: None

CLINICAL DATA: 29-year-old female with irregular menstrual cycles
and clinical concern for polycystic ovarian syndrome.

EXAM:
TRANSABDOMINAL AND TRANSVAGINAL ULTRASOUND OF PELVIS
TECHNIQUE: Both transabdominal and transvaginal ultrasound examinations of the
pelvis were performed. Transabdominal technique was performed for
global imaging of the pelvis including uterus, ovaries, adnexal
regions, and pelvic cul-de-sac. It was necessary to proceed with
endovaginal exam following the transabdominal exam to visualize the
endometrium and ovaries.

[Series 1: us pelvis complete · 15 of 68 slices shown]
[im 1/68]
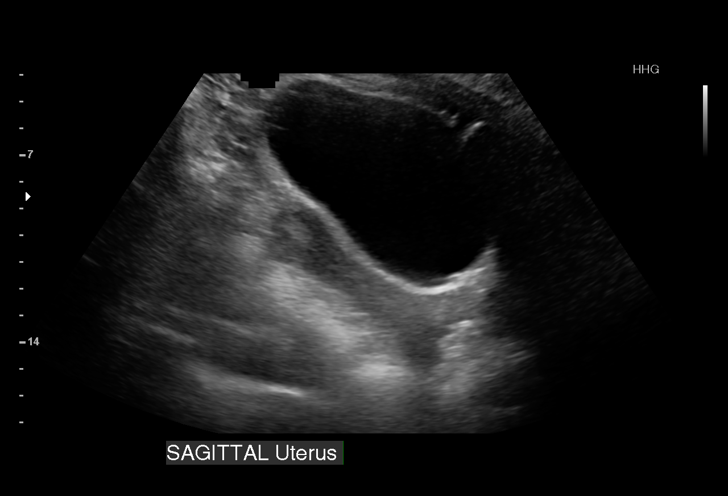
[im 6/68]
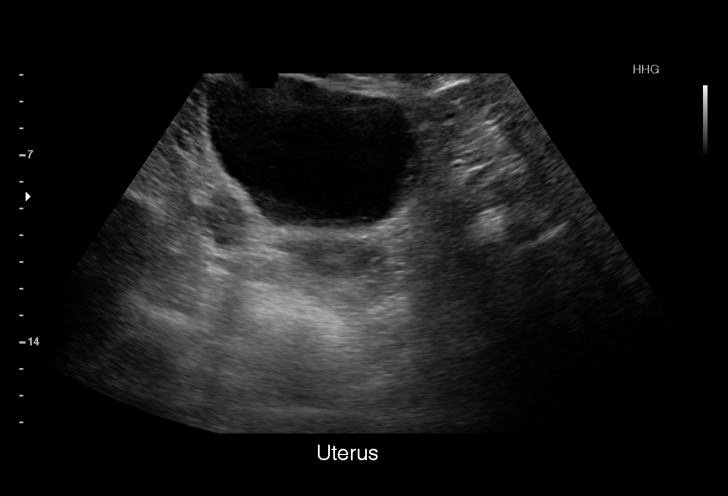
[im 12/68]
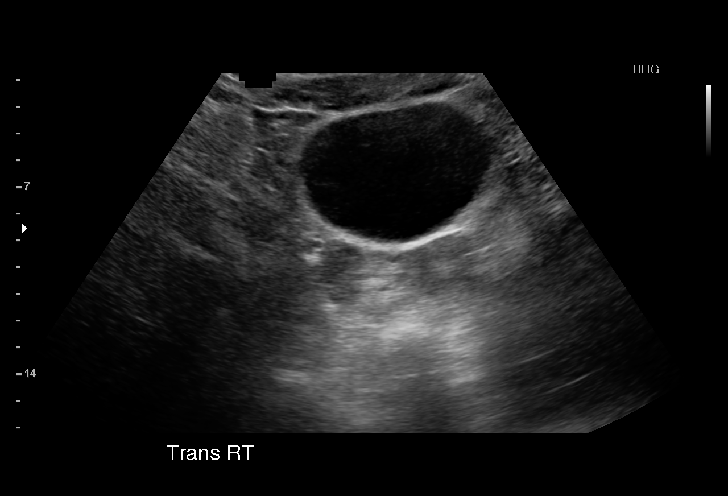
[im 14/68]
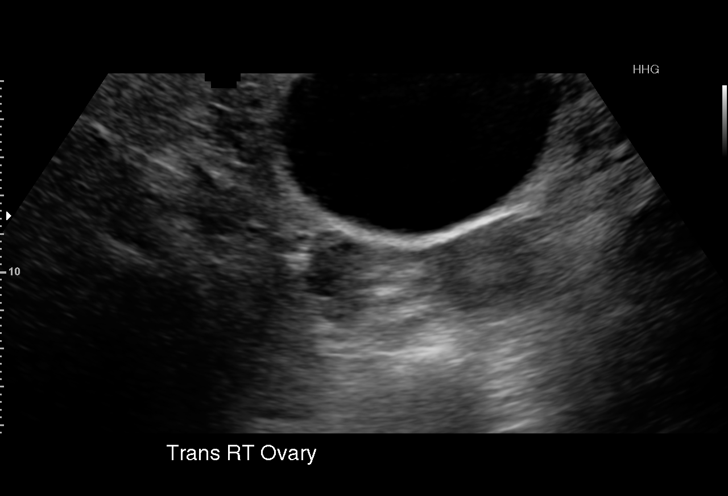
[im 20/68]
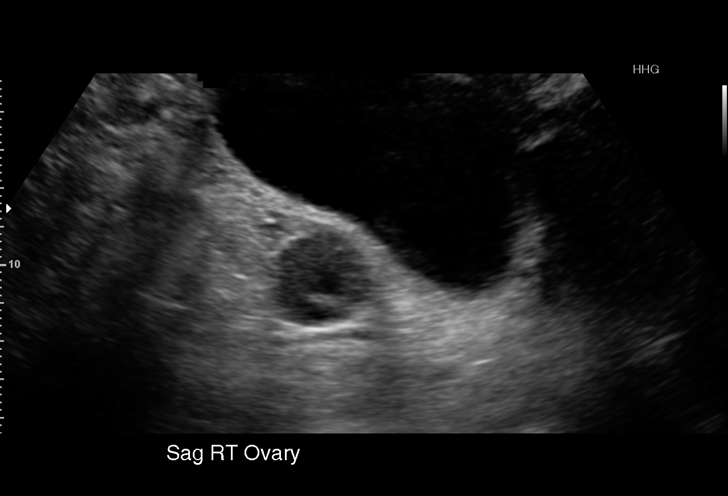
[im 26/68]
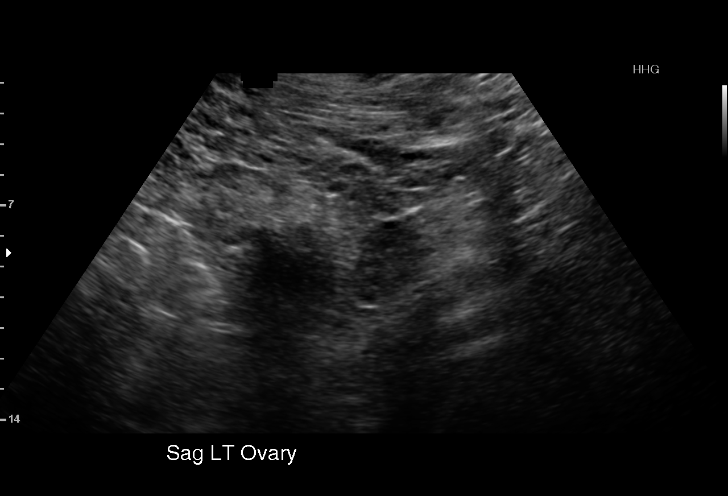
[im 28/68]
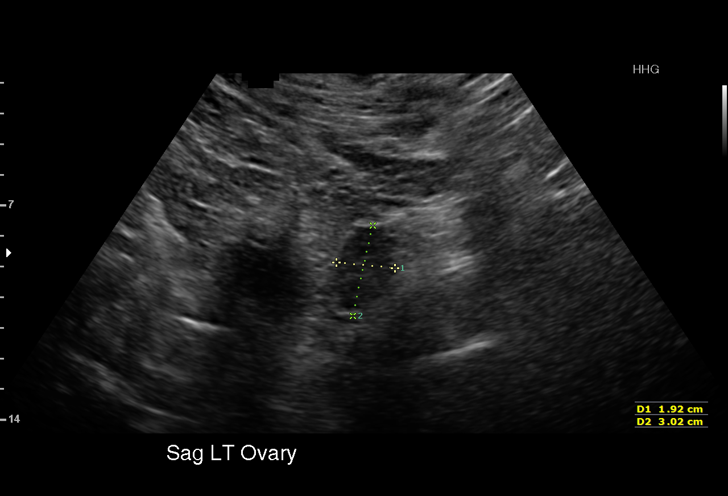
[im 34/68]
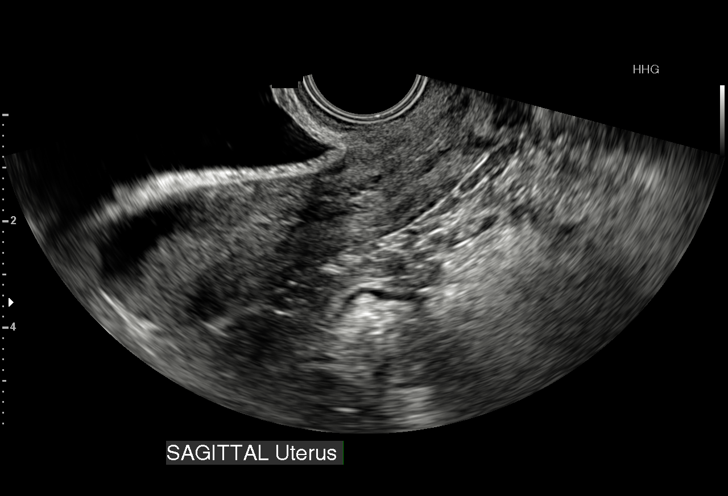
[im 40/68]
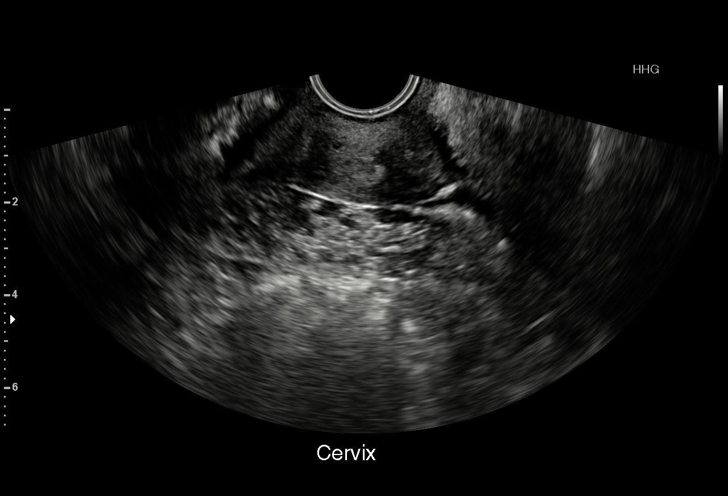
[im 42/68]
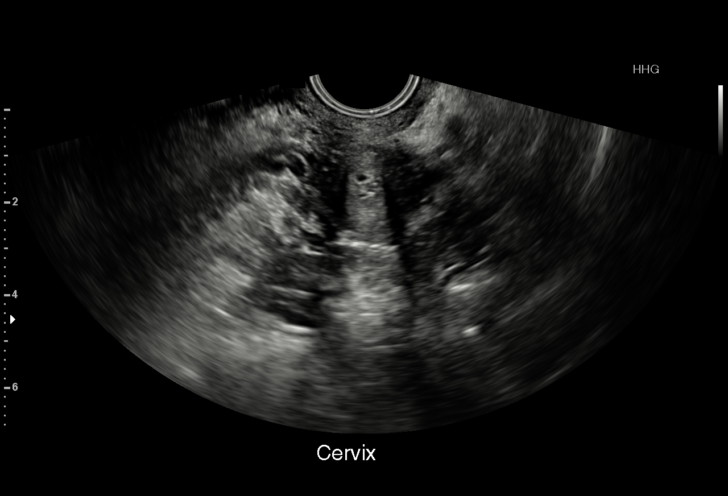
[im 48/68]
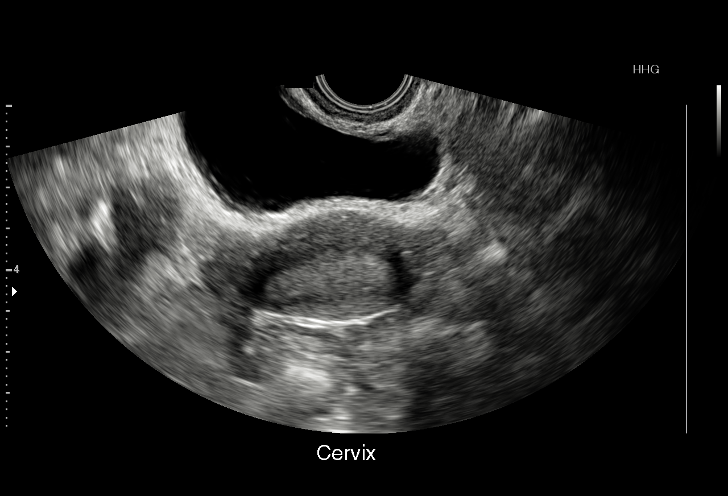
[im 54/68]
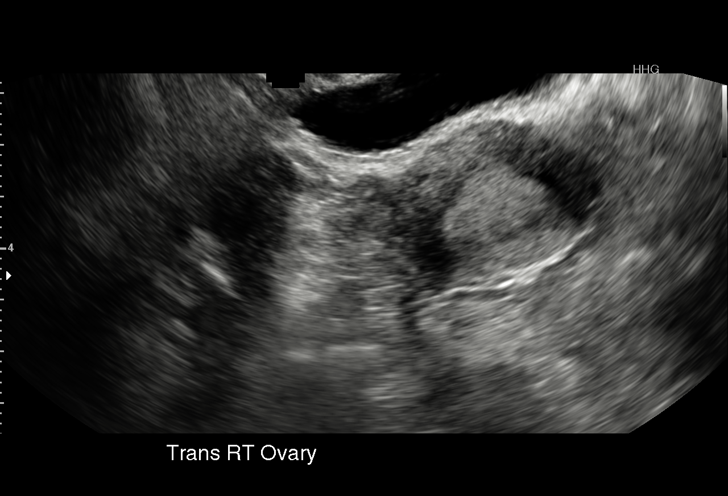
[im 56/68]
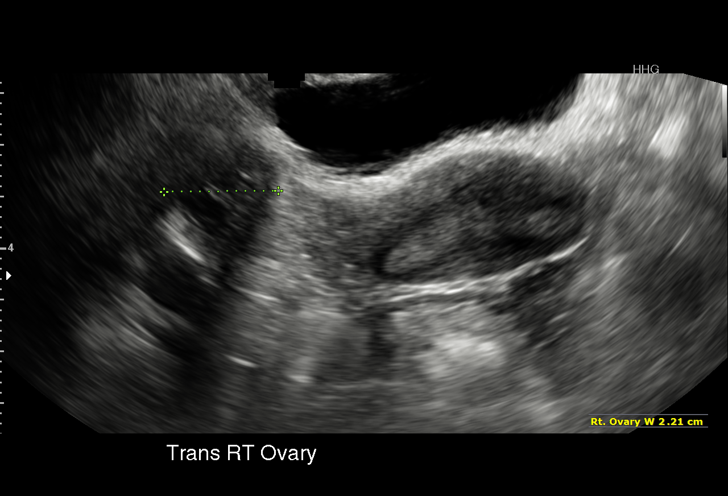
[im 62/68]
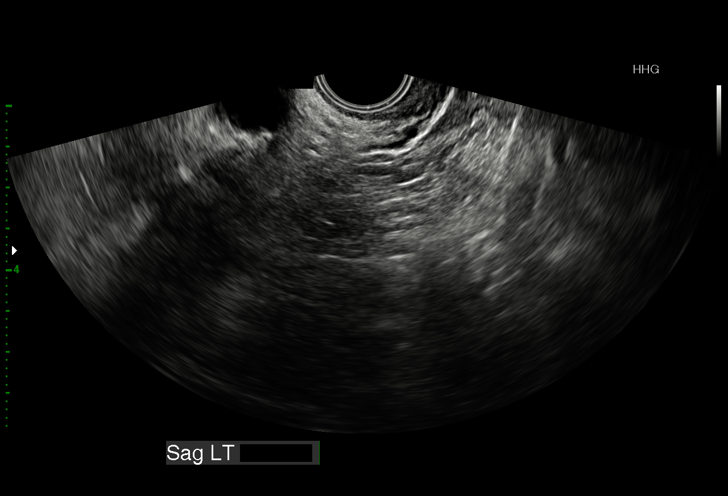
[im 68/68]
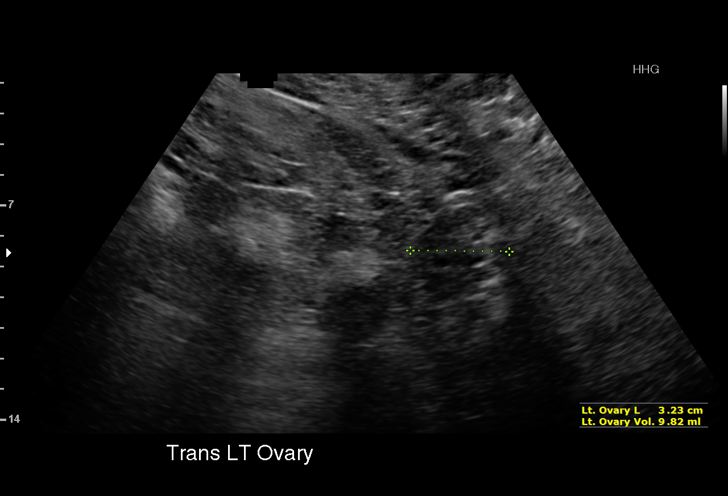

[15 of 25 positions shown; findings below may reference images not displayed]

FINDINGS: Uterus

Measurements: 8.8 x 2.5 x 4.3 cm. The anteverted anteflexed uterus
is normal in size and configuration. There are no uterine fibroids
or other myometrial abnormalities.

Endometrium

Thickness: 7 mm. No endometrial cavity fluid or focal endometrial
mass detected.

Right ovary

Measurements: 2.9 x 2.5 x 2.2 cm, for a right ovarian volume of
cc. There are fewer than 12 subcentimeter right ovarian follicles.
No suspicious right ovarian or right adnexal masses.

Left ovary

The left ovary is visualized only on the transabdominal portion of
the scan. Measurements: 1.9 x 3.0 x 3.2 cm, for a left ovarian
volume of 9.8 cc. There are fewer than 12 subcentimeter left ovarian
follicles. No suspicious left ovarian or left adnexal masses.

Other findings

No abnormal free fluid.
IMPRESSION: 1. Nonenlarged ovaries do not meet sonographic criteria for
polycystic ovarian syndrome. Recommend correlation with clinical
history and serum hormone testing.
2. Normal anteverted uterus. No uterine fibroids. No endometrial
abnormality.

## 2017-09-18 ENCOUNTER — Encounter (HOSPITAL_BASED_OUTPATIENT_CLINIC_OR_DEPARTMENT_OTHER): Payer: Self-pay | Admitting: Emergency Medicine

## 2017-09-18 ENCOUNTER — Other Ambulatory Visit: Payer: Self-pay

## 2017-09-18 ENCOUNTER — Emergency Department (HOSPITAL_BASED_OUTPATIENT_CLINIC_OR_DEPARTMENT_OTHER): Payer: 59

## 2017-09-18 ENCOUNTER — Emergency Department (HOSPITAL_BASED_OUTPATIENT_CLINIC_OR_DEPARTMENT_OTHER)
Admission: EM | Admit: 2017-09-18 | Discharge: 2017-09-18 | Disposition: A | Discharge status: Home / Self Care | Payer: 59 | Attending: Emergency Medicine | Admitting: Emergency Medicine

## 2017-09-18 DIAGNOSIS — N939 Abnormal uterine and vaginal bleeding, unspecified: Secondary | ICD-10-CM | POA: Diagnosis present

## 2017-09-18 DIAGNOSIS — J189 Pneumonia, unspecified organism: Secondary | ICD-10-CM | POA: Diagnosis not present

## 2017-09-18 DIAGNOSIS — Z79899 Other long term (current) drug therapy: Secondary | ICD-10-CM | POA: Insufficient documentation

## 2017-09-18 DIAGNOSIS — R42 Dizziness and giddiness: Secondary | ICD-10-CM | POA: Insufficient documentation

## 2017-09-18 LAB — COMPREHENSIVE METABOLIC PANEL
ALBUMIN: 3.9 g/dL (ref 3.5–5.0)
ALT: 21 U/L (ref 0–44)
AST: 21 U/L (ref 15–41)
Alkaline Phosphatase: 58 U/L (ref 38–126)
Anion gap: 9 (ref 5–15)
BUN: 10 mg/dL (ref 6–20)
CO2: 25 mmol/L (ref 22–32)
CREATININE: 0.76 mg/dL (ref 0.44–1.00)
Calcium: 8.6 mg/dL — ABNORMAL LOW (ref 8.9–10.3)
Chloride: 105 mmol/L (ref 98–111)
GFR calc Af Amer: >60 mL/min (ref >60)
GLUCOSE: 95 mg/dL (ref 70–99)
Potassium: 3.4 mmol/L — ABNORMAL LOW (ref 3.5–5.1)
Sodium: 139 mmol/L (ref 135–145)
Total Bilirubin: 0.7 mg/dL (ref 0.3–1.2)
Total Protein: 8.1 g/dL (ref 6.5–8.1)

## 2017-09-18 LAB — CBC WITH DIFFERENTIAL/PLATELET
BASOS ABS: 0.1 10*3/uL (ref 0.0–0.1)
BASOS PCT: 1 %
EOS PCT: 2 %
Eosinophils Absolute: 0.1 10*3/uL (ref 0.0–0.7)
HEMATOCRIT: 38.4 % (ref 36.0–46.0)
Hemoglobin: 12.8 g/dL (ref 12.0–15.0)
Lymphocytes Relative: 35 %
Lymphs Abs: 2.7 10*3/uL (ref 0.7–4.0)
MCH: 27.8 pg (ref 26.0–34.0)
MCHC: 33.3 g/dL (ref 30.0–36.0)
MCV: 83.3 fL (ref 78.0–100.0)
MONO ABS: 0.7 10*3/uL (ref 0.1–1.0)
Monocytes Relative: 10 %
NEUTROS ABS: 4.1 10*3/uL (ref 1.7–7.7)
Neutrophils Relative %: 52 %
PLATELETS: 389 10*3/uL (ref 150–400)
RBC: 4.61 MIL/uL (ref 3.87–5.11)
RDW: 13.6 % (ref 11.5–15.5)
WBC: 7.6 10*3/uL (ref 4.0–10.5)

## 2017-09-18 LAB — PREGNANCY, URINE: Preg Test, Ur: NEGATIVE

## 2017-09-18 MED ORDER — ONDANSETRON HCL 4 MG/2ML IJ SOLN
4.0000 mg | Freq: Once | INTRAMUSCULAR | Status: AC
  Administered 2017-09-18: 4 mg via INTRAVENOUS
  Filled 2017-09-18: qty 2

## 2017-09-18 MED ORDER — AZITHROMYCIN 250 MG PO TABS: 250.0000 mg | ORAL_TABLET | Freq: Every day | ORAL | 0 refills | Status: AC

## 2017-09-18 MED ORDER — SODIUM CHLORIDE 0.9 % IV BOLUS
1000.0000 mL | Freq: Once | INTRAVENOUS | Status: AC
  Administered 2017-09-18: 1000 mL via INTRAVENOUS

## 2017-09-18 MED ORDER — KETOROLAC TROMETHAMINE 15 MG/ML IJ SOLN
15.0000 mg | Freq: Once | INTRAMUSCULAR | Status: AC
  Administered 2017-09-18: 15 mg via INTRAVENOUS
  Filled 2017-09-18: qty 1

## 2017-09-18 MED ORDER — AZITHROMYCIN 250 MG PO TABS
500.0000 mg | ORAL_TABLET | Freq: Once | ORAL | Status: AC
  Administered 2017-09-18: 500 mg via ORAL
  Filled 2017-09-18: qty 2

## 2017-09-18 NOTE — ED Notes (Signed)
Delay in Orthostatics due to RN obtaining blood and then xray

## 2017-09-18 NOTE — Discharge Instructions (Signed)
Take antibiotics as prescribed.  Take the entire course, even if your symptoms improve. Make sure you are staying well-hydrated with water. Use Tylenol or ibuprofen as needed for headache. Follow-up with your OB/GYN for further evaluation of your vaginal bleeding. Return to the emergency room if you develop persistent high fevers despite medication, chest pain, difficulty breathing, persistent dizziness, or any new or concerning symptoms.

## 2017-09-18 NOTE — ED Notes (Signed)
Patient transported to X-ray 

## 2017-09-18 NOTE — ED Notes (Signed)
Pt lying flat for orthostatics 

## 2017-09-18 NOTE — ED Triage Notes (Signed)
Patient states that she had not had a "cycle" for 3 year due to PCOS  - the patient reports that she started her cycle on the 2nd and she has been bleeding since. Today she started to having increased bleeding and saturating a super tampon in about an hour. Patient states that she is now dizzy and feels like her heart is racing

## 2017-09-19 LAB — TSH: TSH: 4.57 u[IU]/mL — AB (ref 0.350–4.500)

## 2017-09-19 NOTE — ED Provider Notes (Signed)
[facility] EMERGENCY DEPARTMENT Provider Note   CSN: [id number] Arrival date & time: 09/18/17  1836     History   Chief Complaint Chief Complaint  Patient presents with  . Dizziness    HPI Lauren Monroe is a 32 y.o. female presenting for evaluation of dizziness and vaginal bleeding.  Patient states she has been having issues with her.  Due to PCOS for the past several years.  She went 2 years without a.,  Was recently started on OCPs a month ago.  For the past 2 weeks, she has had persistent vaginal bleeding, which came heavier in the past 2 days.  She states that she soaked through the pad an hour.  Today around lunchtime, she started to develop dizziness.  This is present when going from sitting to standing, worse when she is standing for extended periods of time.  She denies dizziness while lying flat.  Patient has associated feeling of palpitations when she is feeling dizzy.  Patient states for the past 3 days, she has been feeling cold.  She reports a mild productive cough for which she has been taking Mucinex.  She denies chest pain or shortness of breath.  She has some mild nausea which began today, no vomiting.  No significant abdominal pain.  She denies urinary symptoms or abnormal bowel movements.  Patient has a history of hypothyroidism for which she is taking levothyroxine.  With her levels were last checked in May/April, her dose was increased.  They have not been rechecked since.  Patient denies history of needing a blood transfusion for previous periods.   HPI  Past Medical History:  Diagnosis Date  . Migraine     Patient Active Problem List   Diagnosis Date Noted  . PCOS (polycystic ovarian syndrome) 05/01/2015  . Insulin resistance 04/03/2015  . Anovulatory (dysfunctional uterine) bleeding 04/03/2015  . Hirsutism 04/03/2015    History reviewed. No pertinent surgical history.   OB History    Gravida  0   Para  0   Term  0   Preterm  0   AB  0   Living  0     SAB  0   TAB  0   Ectopic  0   Multiple  0   Live Births               Home Medications    Prior to Admission medications   Medication Sig Start Date End Date Taking? Authorizing Provider  levothyroxine (SYNTHROID, LEVOTHROID) 75 MCG tablet Take one tab po daily 06/09/17  Yes [provider]  norethindrone-ethinyl estradiol (MICROGESTIN,JUNEL,LOESTRIN) 1-20 MG-MCG tablet Take by mouth. 08/13/17 11/05/17 Yes [provider]  acetaminophen (TYLENOL) 500 MG tablet Take 500 mg by mouth every 6 (six) hours as needed. Reported on 04/03/2015    [provider]  aspirin-acetaminophen-caffeine (EXCEDRIN MIGRAINE) 551 311 0719250-250-65 MG tablet Take 1 tablet by mouth every 6 (six) hours as needed for headache.    [provider]  azithromycin (ZITHROMAX) 250 MG tablet Take 1 tablet (250 mg total) by mouth daily. Take 1 every day until finished. 09/18/17   Ayrton Mcvay, PA-C  docusate sodium (COLACE) 100 MG capsule Take 1 capsule (100 mg total) by mouth every 12 (twelve) hours. 08/14/15   Ward, Layla MawKristen N, DO  HYDROcodone-acetaminophen (NORCO/VICODIN) 5-325 MG tablet Take 1-2 tablets by mouth every 6 (six) hours as needed. 08/14/15   Ward, Layla MawKristen N, DO  phentermine 15 MG capsule Take by mouth.  [provider]    Family History History reviewed. No pertinent family history.  Social History Social History   Tobacco Use  . Smoking status: Never Smoker  . Smokeless tobacco: Never Used  Substance Use Topics  . Alcohol use: No  . Drug use: No     Allergies   Zoloft [sertraline hcl]   Review of Systems Review of Systems  Constitutional: Positive for chills.  HENT: Positive for congestion.   Eyes: Negative for visual disturbance.  Respiratory: Positive for cough.   Cardiovascular: Positive for palpitations. Negative for leg swelling.  Gastrointestinal: Positive for nausea. Negative for vomiting.  Genitourinary:  Positive for vaginal bleeding. Negative for dysuria, frequency and hematuria.  Neurological: Positive for dizziness.  Hematological: Does not bruise/bleed easily.  All other systems reviewed and are negative.    Physical Exam Updated Vital Signs BP 120/77 (BP Location: Left Arm)   Pulse 100   Temp 98.3 F (36.8 C) (Oral)   Resp 18   Ht 5\' 2"  (1.575 m)   Wt 99.8 kg   LMP 09/17/2017 Comment: PCOS; no menstrual cycle x 3 yrs; started bleeding yest.  SpO2 99%   BMI 40.24 kg/m   Physical Exam  Constitutional: She is oriented to person, place, and time. She appears well-developed and well-nourished. No distress.  Patient appears uncomfortable, but in no acute distress.  HENT:  Head: Normocephalic and atraumatic.  OP clear without tonsillar swelling or exudate.  Uvula midline with equal palate rise.  TMs nonerythematous and nonbulging bilaterally.  Mild nasal mucosal edema.  Eyes: Pupils are equal, round, and reactive to light. Conjunctivae and EOM are normal.  EOMI and PERRLA.  No nystagmus.  Neck: Normal range of motion. Neck supple.  Cardiovascular: Regular rhythm and intact distal pulses.  Mildly tachycardic around 105  Pulmonary/Chest: Effort normal and breath sounds normal. No respiratory distress. She has no wheezes.  Speaking in full sentences.  Decreased lung sounds in bases bilaterally  Abdominal: Soft. She exhibits no distension and no mass. There is tenderness. There is no rebound and no guarding.  Generalized tenderness palpation of the abdomen.  Patient states this is baseline.  No new or worsening tenderness per patient.  No masses.  Negative rebound and no signs of peritonitis.  Genitourinary: Rectum normal and uterus normal. Pelvic exam was performed with patient supine. There is no rash, tenderness or lesion on the right labia. There is no rash, tenderness or lesion on the left labia. Cervix exhibits no motion tenderness and no discharge. Right adnexum displays no mass,  no tenderness and no fullness. Left adnexum displays no mass, no tenderness and no fullness. There is bleeding in the vagina.  Genitourinary Comments: Chaperone present.  Minimal more blood noted in the vaginal canal.  No obvious masses.  No CMT or adnexal tenderness.  Musculoskeletal: Normal range of motion.  Neurological: She is alert and oriented to person, place, and time.  Skin: Skin is warm and dry.  Psychiatric: She has a normal mood and affect.  Nursing note and vitals reviewed.    ED Treatments / Results  Labs (all labs ordered are listed, but only abnormal results are displayed) Labs Reviewed  COMPREHENSIVE METABOLIC PANEL - Abnormal; Notable for the following components:      Result Value   Potassium 3.4 (*)    Calcium 8.6 (*)    All other components within normal limits  CBC WITH DIFFERENTIAL/PLATELET  PREGNANCY, URINE  TSH    EKG EKG Interpretation  Date/Time:  Saturday September 18 2017 20:24:47 EDT Ventricular Rate:  104 PR Interval:    QRS Duration: 88 QT Interval:  332 QTC Calculation: 437 R Axis:   58 Text Interpretation:  Sinus tachycardia Borderline prolonged PR interval No previous ECGs available Confirmed by Richardean Canal [phone]) on 09/18/2017 10:48:05 PM   Radiology Dg Chest 2 View  Result Date: 09/18/2017 CLINICAL DATA:  Patient with palpitations.  Cough. EXAM: CHEST - 2 VIEW COMPARISON:  Chest radiograph 02/07/2009. FINDINGS: Stable cardiac and mediastinal contours. Bilateral patchy areas of consolidation within the right lower lung and left mid lung. No pleural effusion or pneumothorax. IMPRESSION: Bilateral patchy areas of consolidation raising the possibility of multifocal pneumonia. Followup PA and lateral chest X-ray is recommended in 3-4 weeks following trial of antibiotic therapy to ensure resolution and exclude underlying malignancy. Electronically Signed   By: Annia Belt M.D.   On: 09/18/2017 21:05    Procedures Procedures (including critical  care time)  Medications Ordered in ED Medications  sodium chloride 0.9 % bolus 1,000 mL (0 mLs Intravenous Stopped 09/18/17 2244)  ondansetron (ZOFRAN) injection 4 mg (4 mg Intravenous Given 09/18/17 2235)  ketorolac (TORADOL) 15 MG/ML injection 15 mg (15 mg Intravenous Given 09/18/17 2235)  azithromycin (ZITHROMAX) tablet 500 mg (500 mg Oral Given 09/18/17 2332)     Initial Impression / Assessment and Plan / ED Course  I have reviewed the triage vital signs and the nursing notes.  Pertinent labs & imaging results that were available during my care of the patient were reviewed by me and considered in my medical decision making (see chart for details).     Presenting for evaluation of dizziness, palpitations, vaginal bleeding.  Initial exam shows patient who appears uncomfortable, but in no acute distress.  Initially tachycardic with decreased breath sounds bilaterally.  Initially concerned about patient's abdominal exam, although she states this is baseline for her, will not pursue abdominal etiology at this time.  Patient's pelvic exam with minimal blood noted in the canal and no CMT/adnexal tenderness.  Doubt torsion, TOA, or PID.  Vaginal bleeding likely related to her several year history of having no periods.  Ultrasound is not available at this time.  Patient to follow-up with her OB/GYN for further evaluation of her vaginal bleeding and possible need for outpatient pelvic ultrasound.  Will obtain orthostatics, labs, and start IV fluids.  Chest x-ray due to patient's recent cough and palpitations.  EKG obtained.  Labs reassuring, hgb stable, no leukocytosis. kidney and liver fnx reassuring. TSH sent. EKG without STEMI or concerning findings.  Chest x-ray read interpreted by me, shows patchy infiltrates concerning for multilobar pneumonia.  Orthostatics initially positive, improved after fluids.  Patient reporting headache, history of migraines and states this feels similar.  Will give a dose of  Toradol and Zofran for nausea and reassess.  On reassessment, patient appears much improved.  She sitting up in bed without dizziness or signs of distress.  Discussed findings of pneumonia on x-ray, treatment with antibiotics.  Discussed follow-up with OB/GYN for further evaluation of bleeding.  Discussed lab work including stable hemoglobin.  Discussed that TSH has been sent, can follow-up on MyChart.  Patient given first dose of Z-Pak while in the ER, to take remaining doses at home.  Follow-up with PCP as needed.  At this time, patient appears safe for discharge.  Return precautions given.  Patient states she understands and agrees plan.  Final Clinical Impressions(s) / ED Diagnoses  Final diagnoses:  Community acquired pneumonia, unspecified laterality  Vagina bleeding    ED Discharge Orders         Ordered    azithromycin (ZITHROMAX) 250 MG tablet  Daily     09/18/17 2336           Alveria Apley, PA-C 09/19/17 0057    Charlynne Pander, MD 09/23/17 [phone]

## 2019-05-25 IMAGING — CR DG CHEST 2V
2 series · 2 of 2 positions shown · non-contrast
Comparison: Chest radiograph 02/07/2009.

CLINICAL DATA: Patient with palpitations.  Cough.

EXAM:
CHEST - 2 VIEW

[w chest pa]
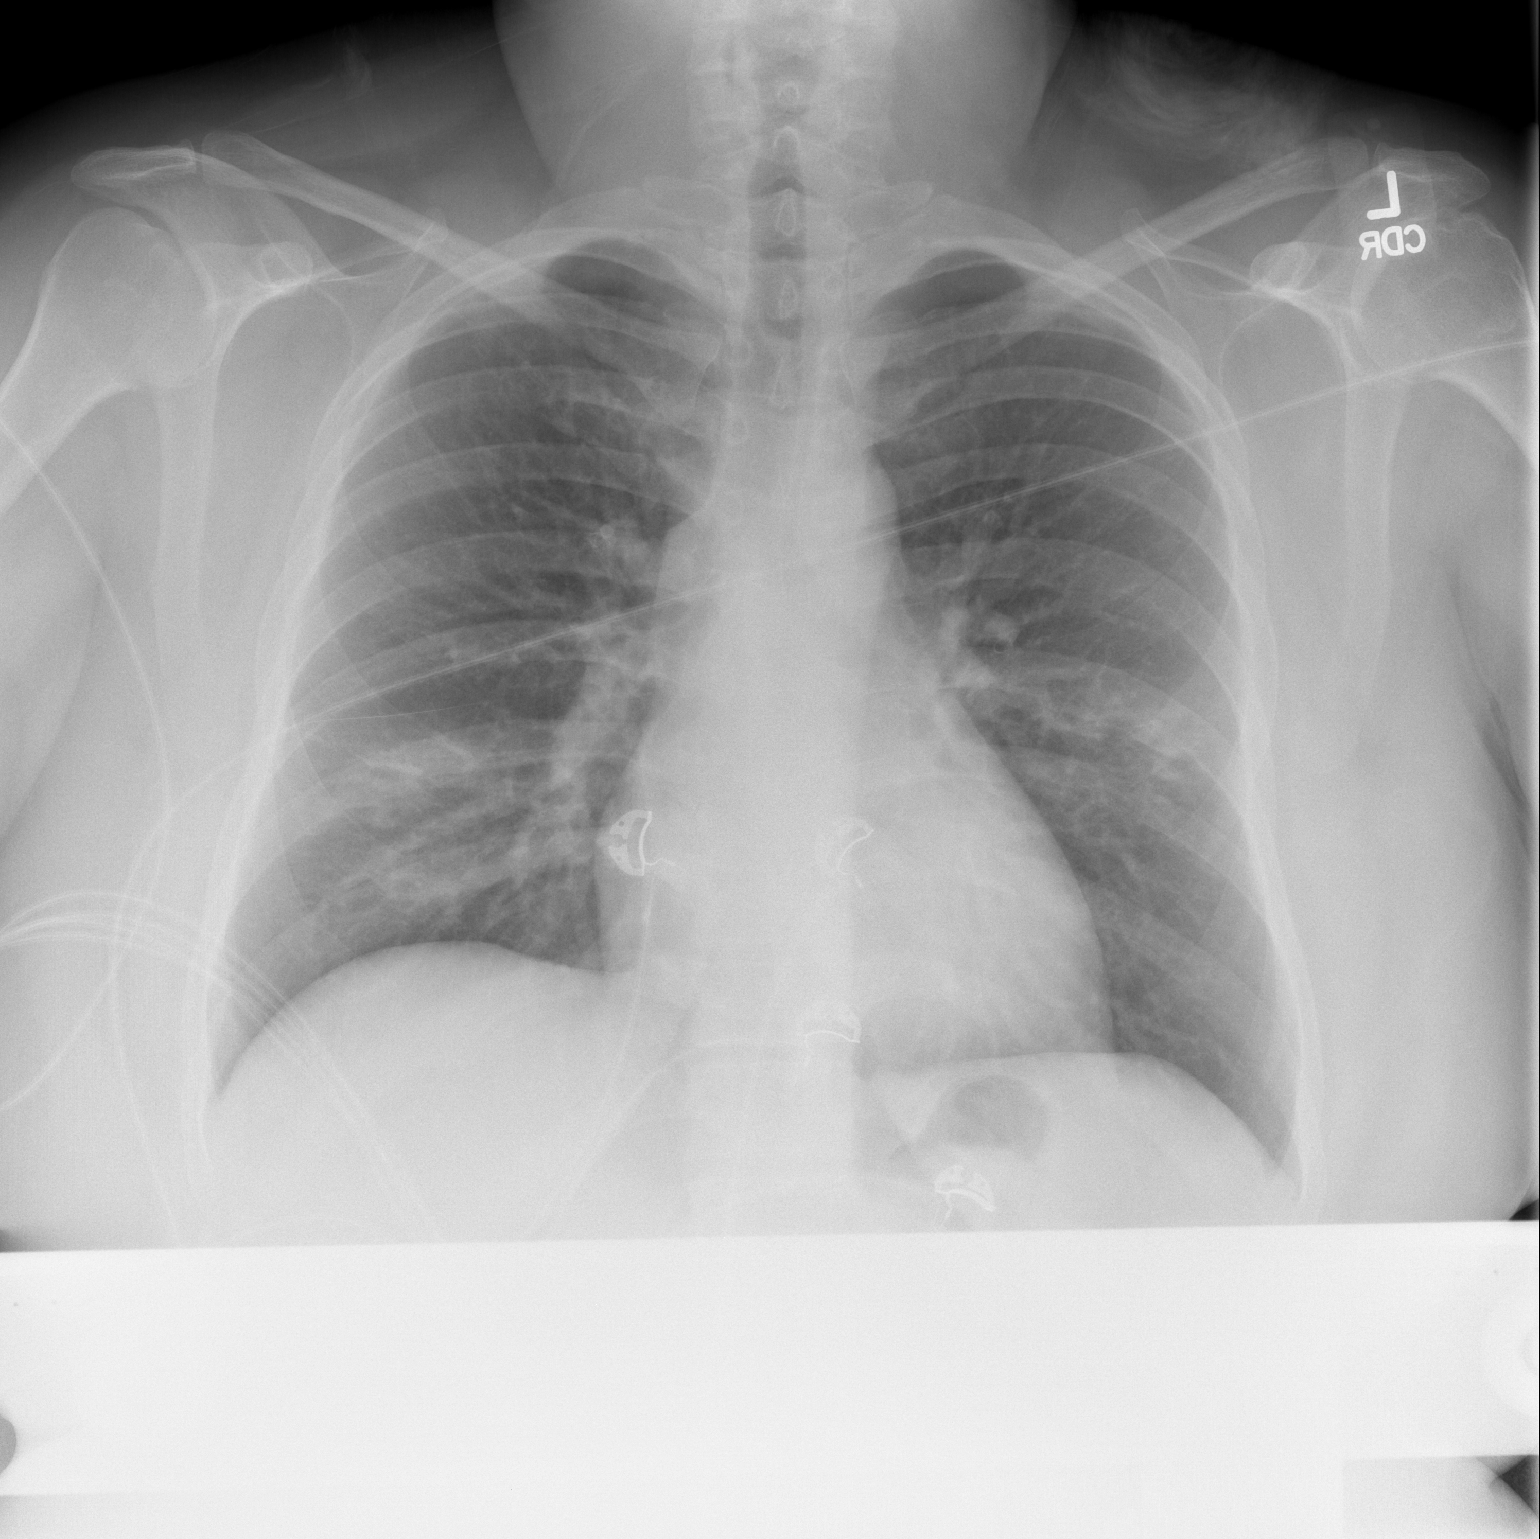

[w chest lat]
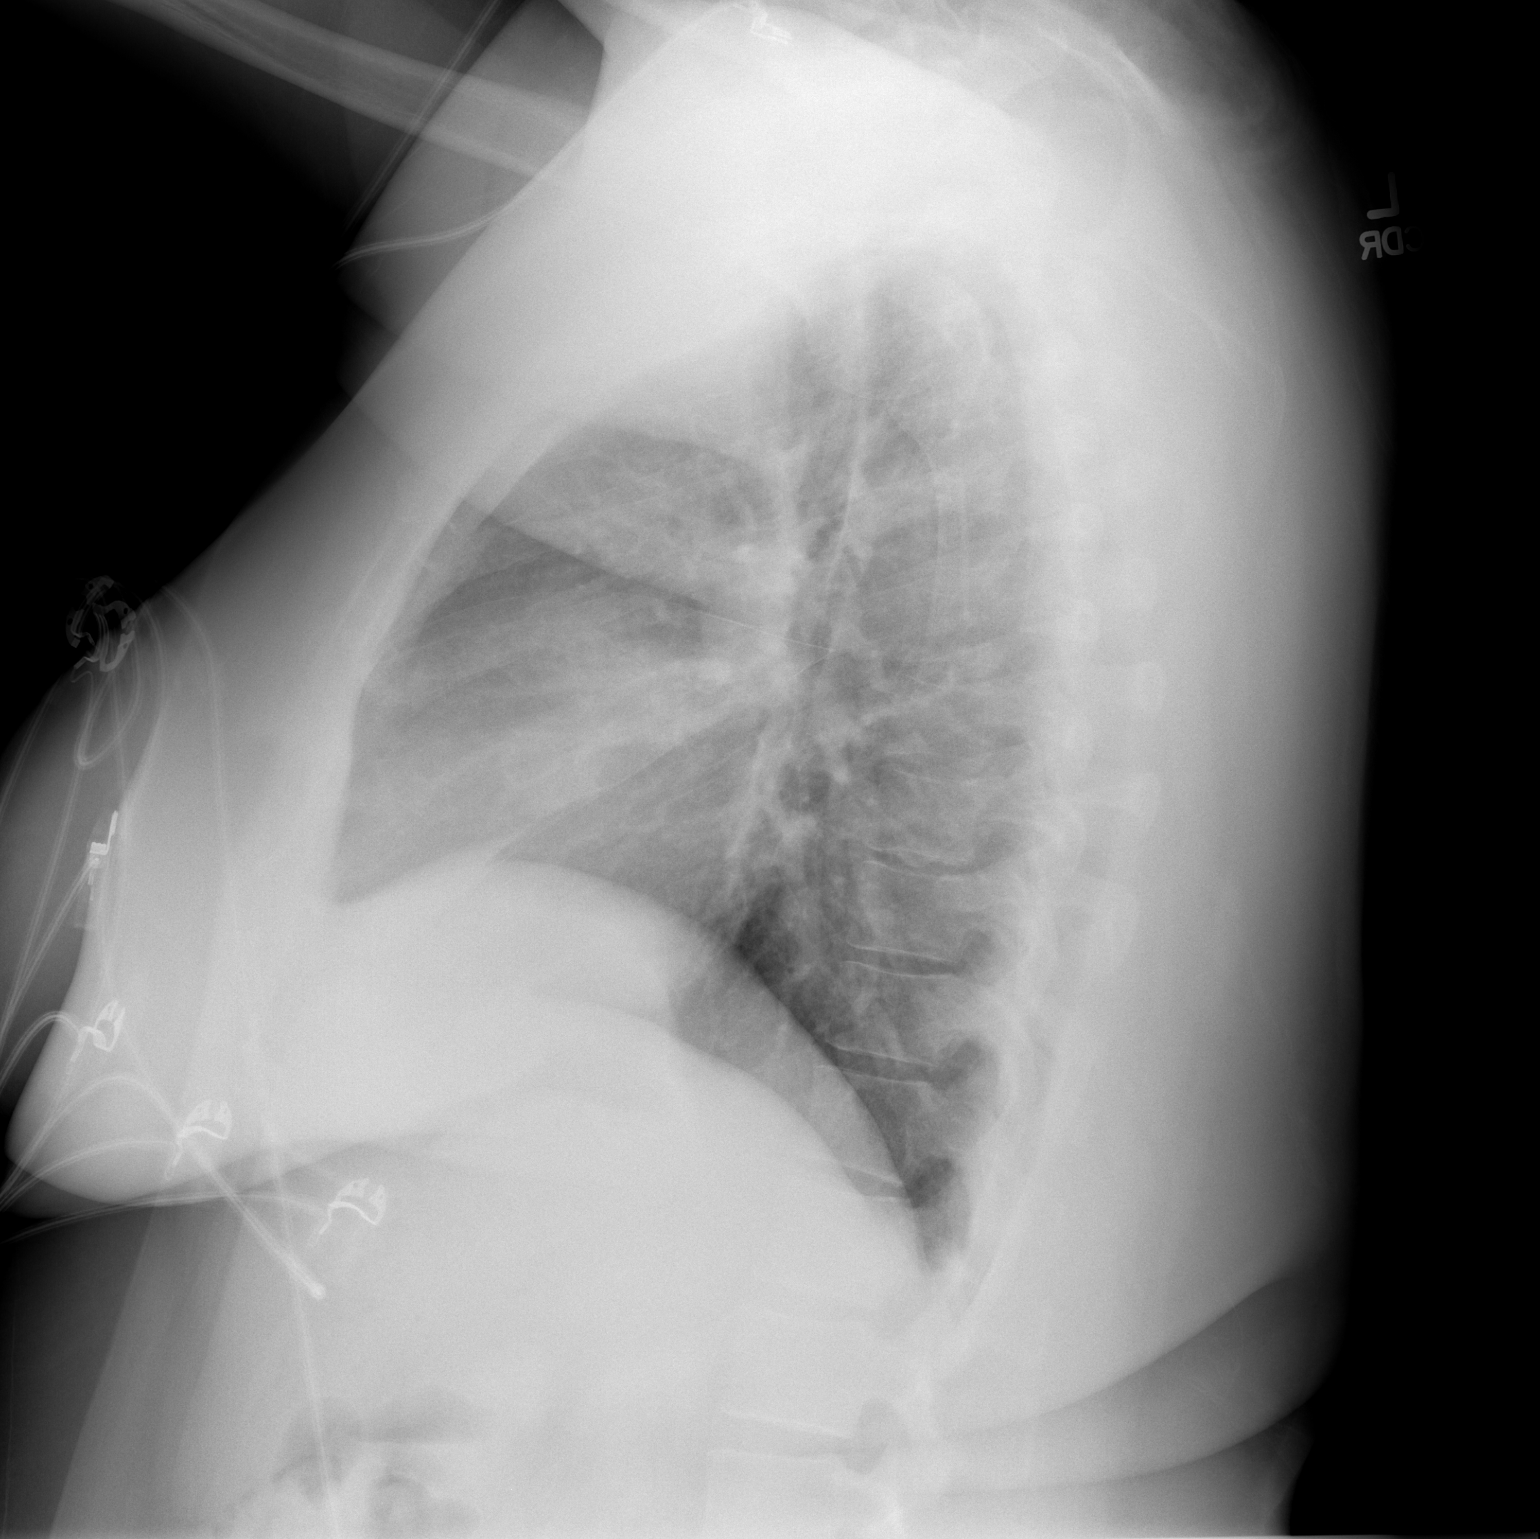

[2 of 2 positions shown; findings below may reference images not displayed]

FINDINGS: Stable cardiac and mediastinal contours. Bilateral patchy areas of
consolidation within the right lower lung and left mid lung. No
pleural effusion or pneumothorax.
IMPRESSION: Bilateral patchy areas of consolidation raising the possibility of
multifocal pneumonia. Followup PA and lateral chest X-ray is
recommended in 3-4 weeks following trial of antibiotic therapy to
ensure resolution and exclude underlying malignancy.
